# Patient Record
Sex: Female | Born: 1975 | Race: Black or African American | Hispanic: No | Marital: Single | Smoking: Never smoker
Health system: Southern US, Community
[De-identification: ages and names within clinical notes are randomized; demographics above are authoritative.]

## PROBLEM LIST (undated history)

## (undated) DIAGNOSIS — K219 Gastro-esophageal reflux disease without esophagitis: Secondary | ICD-10-CM

## (undated) DIAGNOSIS — M1990 Unspecified osteoarthritis, unspecified site: Secondary | ICD-10-CM

## (undated) HISTORY — PX: CARPAL TUNNEL RELEASE: SHX101

---

## 2013-12-11 ENCOUNTER — Ambulatory Visit (INDEPENDENT_AMBULATORY_CARE_PROVIDER_SITE_OTHER): Payer: BC Managed Care – PPO

## 2013-12-11 VITALS — BP 116/86 | HR 86 | Resp 12

## 2013-12-11 DIAGNOSIS — M21619 Bunion of unspecified foot: Secondary | ICD-10-CM

## 2013-12-11 DIAGNOSIS — M2010 Hallux valgus (acquired), unspecified foot: Secondary | ICD-10-CM

## 2013-12-11 DIAGNOSIS — M779 Enthesopathy, unspecified: Secondary | ICD-10-CM

## 2013-12-11 DIAGNOSIS — G5760 Lesion of plantar nerve, unspecified lower limb: Secondary | ICD-10-CM

## 2013-12-11 DIAGNOSIS — M205X9 Other deformities of toe(s) (acquired), unspecified foot: Secondary | ICD-10-CM

## 2013-12-11 NOTE — Progress Notes (Signed)
   Subjective:    Patient ID: Judith Kennedy, female    DOB: 07/14/1976, 38 y.o.   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 THE FEET ARE GETTING WORSE. THE FEET GETTING AGGRAVATED BY WALKING AND JUMPING AND TRIED NO TREATMENT.    Review of Systems  All other systems reviewed and are negative.      Objective:   Physical Exam Lower extremity objective findings as follows vascular status is intact with pedal pulses palpable DP +2/4 PT +2/4 bilateral capillary refill time 3 seconds all digits. Epicritic and proprioceptive sensations intact and symmetric bilateral. Patient is 38 year old female well-developed well-nourished arch x3 vital signs stable. Neurologically epicritic and proprioceptive sensations again intact on neurologic evaluation skin color pigment normal hair growth absent nails normal trophic. Orthopedic biomechanical exam reveals notable bunion deformity bilateral left and right hallux laterally deviated pushing against the lateral digits as well. There's adductovarus rotation lesser digits 34 and 5 some dorsal displacement second digits bilateral noted clinically and radiographically as well. X-rays reveal elevated I am angle greater than 12 hallux abductus angle greater than 20 to 25 bilateral deviation sesamoid position 5 bilateral. There is some asymmetric joint space narrowing and squaring first metatarsal head bilateral the right great toe joint on clinical evaluation has some dorsal osteophyte and spurring more than left. Patient showing slight elevation of metatarsal and lateral projection first metatarsal. Clinically functional hallux limitus identified with compensatory abduction of the hallux. On weightbearing there is a locking of the great toe joint and early arthritic changes are identified both clinically and radiographically.       Assessment & Plan:  Assessment this time patient does have capsulitis the first MTP  joint both on palpation and range of motion. 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 Patient would benefit from functional orthoses to help stabilize the foot and arch and improve function of the forefoot including the first MTP joint patient is advised however this would not corrected bunion or reduce the bunion deformity and RE cyst or the damage is a recurrent. Patient would be candidate at some point in the future for surgical intervention [facility] with possible decompression and cheilectomy first MTP joint bilateral patient is advised to have that one done at that time. Patient given the options of anti-inflammatory to help temporarily with the pain steroid injections but provided temporary relief the pain however that will not corrected deformity which can only be corrected be a surgical correction. Patient given literature about her bunion deformity and her options for future consideration at this time orthotics skin is carried out for functional orthoses to help stabilize the arch and foot improve the function of the joint. Followup with the next month with orthotics are available for fitting and again future discussions of any other invasive options with a placed per patient request. Patient did request a copy of her x-rays which were furnished for her as well as literature about her options  Alvan Dameichard Gurinder Toral DPM

## 2013-12-11 NOTE — Patient Instructions (Signed)
Osteoarthritis Osteoarthritis is a disease that causes soreness and swelling (inflammation) of a joint. It occurs when the cartilage at the affected joint wears down. Cartilage acts as a cushion, covering the ends of bones where they meet to form a joint. Osteoarthritis is the most common form of arthritis. It often occurs in older people. The joints affected most often by this condition include those in the:  Ends of the fingers.  Thumbs.  Neck.  Lower back.  Knees.  Hips. CAUSES  Over time, the cartilage that covers the ends of bones begins to wear away. This causes bone to rub on bone, producing pain and stiffness in the affected joints.  RISK FACTORS Certain factors can increase your chances of having osteoarthritis, including:  Older age.  Excessive body weight.  Overuse of joints. SIGNS AND SYMPTOMS   Pain, swelling, and stiffness in the joint.  Over time, the joint may lose its normal shape.  Small deposits of bone (osteophytes) may grow on the edges of the joint.  Bits of bone or cartilage can break off and float inside the joint space. This may cause more pain and damage. DIAGNOSIS  Your health care provider will do a physical exam and ask about your symptoms. Various tests may be ordered, such as:  X-rays of the affected joint.  An MRI scan.  Blood tests to rule out other types of arthritis.  Joint fluid tests. This involves using a needle to draw fluid from the joint and examining the fluid under a microscope. TREATMENT  Goals of treatment are to control pain and improve joint function. Treatment plans may include:  A prescribed exercise program that allows for rest and joint relief.  A weight control plan.  Pain relief techniques, such as:  Properly applied heat and cold.  Electric pulses delivered to nerve endings under the skin (transcutaneous electrical nerve stimulation, TENS).  Massage.  Certain nutritional supplements.  Medicines to  control pain, such as:  Acetaminophen.  Nonsteroidal anti-inflammatory drugs (NSAIDs), such as naproxen.  Narcotic or central-acting agents, such as tramadol.  Corticosteroids. These can be given orally or as an injection.  Surgery to reposition the bones and relieve pain (osteotomy) or to remove loose pieces of bone and cartilage. Joint replacement may be needed in advanced states of osteoarthritis. HOME CARE INSTRUCTIONS   Only take over-the-counter or prescription medicines as directed by your health care provider. Take all medicines exactly as instructed.  Maintain a healthy weight. Follow your health care provider's instructions for weight control. This may include dietary instructions.  Exercise as directed. Your health care provider can recommend specific types of exercise. These may include:  Strengthening exercises These are done to strengthen the muscles that support joints affected by arthritis. They can be performed with weights or with exercise bands to add resistance.  Aerobic activities These are exercises, such as brisk walking or low-impact aerobics, that get your heart pumping.  Range-of-motion activities These keep your joints limber.  Balance and agility exercises These help you maintain daily living skills.  Rest your affected joints as directed by your health care provider.  Follow up with your health care provider as directed. SEEK MEDICAL CARE IF:   Your skin turns red.  You develop a rash in addition to your joint pain.  You have worsening joint pain. SEEK IMMEDIATE MEDICAL CARE IF:  You have a significant loss of weight or appetite.  You have a fever along with joint or muscle aches.  You have   night sweats. FOR MORE INFORMATION  National Institute of Arthritis and Musculoskeletal and Skin Diseases: www.niams.http://www.myers.net/nih.gov [employer] A[employer]: https://walker.com/www.nia.nih.gov American College of Rheumatology: www.rheumatology.org Document Released: 07/30/2005  Document Revised: 05/20/2013 Document Reviewed: 04/06/2013 [facility] Patient Information 2014 [place], [place]. Bunion You have a bunion deformity of the feet. This is more common in women. It tends to be an inherited problem. Symptoms can include pain, swelling, and deformity around the great toe. Numbness and tingling may also be present. Your symptoms are often worsened by wearing shoes that cause pressure on the bunion. Changing the type of shoes you wear helps reduce symptoms. A wide shoe decreases pressure on the bunion. An arch support may be used if you have flat feet. Avoid shoes with heels higher than two inches. This puts more pressure on the bunion. X-rays may be helpful in evaluating the severity of the problem. Other foot problems often seen with bunions include corns, calluses, and hammer toes. If the deformity or pain is severe, surgical treatment may be necessary. Keep off your painful foot as much as possible until the pain is relieved. Call your caregiver if your symptoms are worse.  SEEK IMMEDIATE MEDICAL CARE IF:  You have increased redness, pain, swelling, or other symptoms of infection. Document Released: 07/30/2005 Document Revised: 10/22/2011 Document Reviewed: 01/27/2007 [facility] Patient Information 2014 [place]ExitCare, [place].

## 2014-01-01 ENCOUNTER — Ambulatory Visit (INDEPENDENT_AMBULATORY_CARE_PROVIDER_SITE_OTHER): Payer: BC Managed Care – PPO | Admitting: *Deleted

## 2014-01-01 DIAGNOSIS — G5760 Lesion of plantar nerve, unspecified lower limb: Secondary | ICD-10-CM

## 2014-01-01 NOTE — Patient Instructions (Signed)

## 2014-01-01 NOTE — Progress Notes (Signed)
   Subjective:    Patient ID: Judith Kennedy, female    DOB: [birth year], 39 y.o.   MRN: [id number]  HPI PICK UP ORTHOTICS AND GIVEN INSTRUCTION.   Review of Systems     Objective:   Physical Exam        Assessment & Plan:

## 2019-02-25 ENCOUNTER — Other Ambulatory Visit: Payer: Self-pay

## 2019-02-25 DIAGNOSIS — Z20822 Contact with and (suspected) exposure to covid-19: Secondary | ICD-10-CM

## 2019-03-01 LAB — NOVEL CORONAVIRUS, NAA: SARS-CoV-2, NAA: NOT DETECTED

## 2021-02-14 ENCOUNTER — Ambulatory Visit: Payer: Self-pay | Admitting: Student

## 2021-02-17 NOTE — Patient Instructions (Addendum)
DUE TO COVID-19 ONLY ONE VISITOR IS ALLOWED TO COME WITH YOU AND STAY IN THE WAITING ROOM ONLY DURING PRE OP AND PROCEDURE.   **NO VISITORS ARE ALLOWED IN THE SHORT STAY AREA OR RECOVERY ROOM!!**  IF YOU WILL BE ADMITTED INTO THE HOSPITAL YOU ARE ALLOWED ONLY TWO SUPPORT PEOPLE DURING VISITATION HOURS ONLY (10AM -8PM)   The support person(s) may change daily. The support person(s) must pass our screening, gel in and out, and wear a mask at all times, including in the patient's room. Patients must also wear a mask when staff or their support person are in the room.  No visitors under the age of 37. Any visitor under the age of 63 must be accompanied by an adult.    COVID SWAB TESTING MUST BE COMPLETED ON:  Wednesday, 03-01-21 @ 10:00 AM   [address] [place], [place] [address]   You are not required to quarantine, however you are required to wear a well-fitted mask when you are out and around people not in your household.  Hand Hygiene often Do NOT share personal items Notify your provider if you are in close contact with someone who has COVID or you develop fever 100.4 or greater, new onset of sneezing, cough, sore throat, shortness of breath or body aches.         Your procedure is scheduled on:  Thursday, 03-02-21   Report to [facility] Main  Entrance   Report to Short Stay at 5:15 AM   [facility])    Call this number if you have problems the morning of surgery [phone]   Do not eat food :After Midnight.   May have liquids until 4:30 AM  day of surgery  CLEAR LIQUID DIET  Foods Allowed                                                                     Foods Excluded  Water, Black Coffee and tea, regular and decaf                liquids that you cannot  Plain Jell-O in any flavor  (No red)                                      see through such as: Fruit ices (not with fruit pulp)                                      milk, soups, orange juice              Iced  Popsicles (No red)                                      All solid food                                   Apple juices Sports drinks like Gatorade (No red)  Lightly seasoned clear broth or consume(fat free) Sugar, honey syrup     Complete one Ensure drink the morning of surgery at 4:30 AM  the day of surgery.       The day of surgery:  Drink ONE (1) Pre-Surgery Clear Ensure by am the morning of surgery. Drink in one sitting. Do not sip.  This drink was given to you during your hospital  pre-op appointment visit. Nothing else to drink after completing the Pre-Surgery Clear Ensure          If you have questions, please contact your surgeon's office.     Oral Hygiene is also important to reduce your risk of infection.                                    Remember - BRUSH YOUR TEETH THE MORNING OF SURGERY WITH YOUR REGULAR TOOTHPASTE   Do NOT smoke after Midnight   Take these medicines the morning of surgery with A SIP OF WATER:  Zofran if needed                              You may not have any metal on your body including hair pins, jewelry, and body piercing             Do not wear make-up, lotions, powders, perfumes/ or deodorant  Do not wear nail polish including gel and S&S, artificial/acrylic nails, or any other type of covering on natural nails including finger and toenails. If you have artificial nails, gel coating, etc. that needs to be removed by a nail salon please have this removed prior to surgery or surgery may need to be canceled/ delayed if the surgeon/ anesthesia feels like they are unable to be safely monitored.   Do not shave  48 hours prior to surgery.         Do not bring valuables to the hospital. [facility] IS NOT  RESPONSIBLE   FOR VALUABLES.   Contacts, dentures or bridgework may not be worn into surgery.   Bring small overnight bag day of surgery.   Please read over the following fact sheets you were given: IF YOU HAVE QUESTIONS ABOUT YOUR PRE OP  INSTRUCTIONS PLEASE CALL [phone] [facility] - Preparing for Surgery Before surgery, you can play an important role.  Because skin is not sterile, your skin needs to be as free of germs as possible.  You can reduce the number of germs on your skin by washing with CHG (chlorahexidine gluconate) soap before surgery.  CHG is an antiseptic cleaner which kills germs and bonds with the skin to continue killing germs even after washing. Please DO NOT use if you have an allergy to CHG or antibacterial soaps.  If your skin becomes reddened/irritated stop using the CHG and inform your nurse when you arrive at Short Stay. Do not shave (including legs and underarms) for at least 48 hours prior to the first CHG shower.  You may shave your face/neck.  Please follow these instructions carefully:  1.  Shower with CHG Soap the night before surgery and the  morning of surgery.  2.  If you choose to wash your hair, wash your hair first as usual with your normal  shampoo.  3.  After you shampoo, rinse your hair and body thoroughly to remove the  shampoo.                             4.  Use CHG as you would any other liquid soap.  You can apply chg directly to the skin and wash.  Gently with a scrungie or clean washcloth.  5.  Apply the CHG Soap to your body ONLY FROM THE NECK DOWN.   Do   not use on face/ open                           Wound or open sores. Avoid contact with eyes, ears mouth and   genitals (private parts).                       Wash face,  Genitals (private parts) with your normal soap.             6.  Wash thoroughly, paying special attention to the area where your    surgery  will be performed.  7.  Thoroughly rinse your body with warm water from the neck down.  8.  DO NOT shower/wash with your normal soap after using and rinsing off the CHG Soap.                9.  Pat yourself dry with a clean towel.            10.  Wear clean pajamas.            11.  Place clean sheets on your bed  the night of your first shower and do not  sleep with pets. Day of Surgery : Do not apply any lotions/deodorants the morning of surgery.  Please wear clean clothes to the hospital/surgery center.  FAILURE TO FOLLOW THESE INSTRUCTIONS MAY RESULT IN THE CANCELLATION OF YOUR SURGERY  PATIENT SIGNATURE_________________________________  NURSE SIGNATURE__________________________________  ________________________________________________________________________   Rogelia Mire  An incentive spirometer is a tool that can help keep your lungs clear and active. This tool measures how well you are filling your lungs with each breath. Taking long deep breaths may help reverse or decrease the chance of developing breathing (pulmonary) problems (especially infection) following: A long period of time when you are unable to move or be active. BEFORE THE PROCEDURE  If the spirometer includes an indicator to show your best effort, your nurse or respiratory therapist will set it to a desired goal. If possible, sit up straight or lean slightly forward. Try not to slouch. Hold the incentive spirometer in an upright position. INSTRUCTIONS FOR USE  Sit on the edge of your bed if possible, or sit up as far as you can in bed or on a chair. Hold the incentive spirometer in an upright position. Breathe out normally. Place the mouthpiece in your mouth and seal your lips tightly around it. Breathe in slowly and as deeply as possible, raising the piston or the ball toward the top of the column. Hold your breath for 3-5 seconds or for as long as possible. Allow the piston or ball to fall to the bottom of the column. Remove the mouthpiece from your mouth and breathe out normally. Rest for a few seconds and repeat Steps 1 through 7 at least 10 times every 1-2 hours when you are awake. Take your time and take a few normal breaths between deep breaths. The spirometer may include an indicator to  show your best  effort. Use the indicator as a goal to work toward during each repetition. After each set of 10 deep breaths, practice coughing to be sure your lungs are clear. If you have an incision (the cut made at the time of surgery), support your incision when coughing by placing a pillow or rolled up towels firmly against it. Once you are able to get out of bed, walk around indoors and cough well. You may stop using the incentive spirometer when instructed by your caregiver.  RISKS AND COMPLICATIONS Take your time so you do not get dizzy or light-headed. If you are in pain, you may need to take or ask for pain medication before doing incentive spirometry. It is harder to take a deep breath if you are having pain. AFTER USE Rest and breathe slowly and easily. It can be helpful to keep track of a log of your progress. Your caregiver can provide you with a simple table to help with this. If you are using the spirometer at home, follow these instructions: SEEK MEDICAL CARE IF:  You are having difficultly using the spirometer. You have trouble using the spirometer as often as instructed. Your pain medication is not giving enough relief while using the spirometer. You develop fever of 100.5 F (38.1 C) or higher. SEEK IMMEDIATE MEDICAL CARE IF:  You cough up bloody sputum that had not been present before. You develop fever of 102 F (38.9 C) or greater. You develop worsening pain at or near the incision site. MAKE SURE YOU:  Understand these instructions. Will watch your condition. Will get help right away if you are not doing well or get worse. Document Released: 12/10/2006 Document Revised: 10/22/2011 Document Reviewed: 02/10/2007 ExitCare Patient Information 2014 ExitCare, [place].   ________________________________________________________________________  WHAT IS A BLOOD TRANSFUSION? Blood Transfusion Information  A transfusion is the replacement of blood or some of its parts. Blood is made up of  multiple cells which provide different functions. Red blood cells carry oxygen and are used for blood loss replacement. White blood cells fight against infection. Platelets control bleeding. Plasma helps clot blood. Other blood products are available for specialized needs, such as hemophilia or other clotting disorders. BEFORE THE TRANSFUSION  Who gives blood for transfusions?  Healthy volunteers who are fully evaluated to make sure their blood is safe. This is blood bank blood. Transfusion therapy is the safest it has ever been in the practice of medicine. Before blood is taken from a donor, a complete history is taken to make sure that person has no history of diseases nor engages in risky social behavior (examples are intravenous drug use or sexual activity with multiple partners). The donor's travel history is screened to minimize risk of transmitting infections, such as malaria. The donated blood is tested for signs of infectious diseases, such as HIV and hepatitis. The blood is then tested to be sure it is compatible with you in order to minimize the chance of a transfusion reaction. If you or a relative donates blood, this is often done in anticipation of surgery and is not appropriate for emergency situations. It takes many days to process the donated blood. RISKS AND COMPLICATIONS Although transfusion therapy is very safe and saves many lives, the main dangers of transfusion include:  Getting an infectious disease. Developing a transfusion reaction. This is an allergic reaction to something in the blood you were given. Every precaution is taken to prevent this. The decision to have a blood transfusion has  been considered carefully by your caregiver before blood is given. Blood is not given unless the benefits outweigh the risks. AFTER THE TRANSFUSION Right after receiving a blood transfusion, you will usually feel much better and more energetic. This is especially true if your red blood  cells have gotten low (anemic). The transfusion raises the level of the red blood cells which carry oxygen, and this usually causes an energy increase. The nurse administering the transfusion will monitor you carefully for complications. HOME CARE INSTRUCTIONS  No special instructions are needed after a transfusion. You may find your energy is better. Speak with your caregiver about any limitations on activity for underlying diseases you may have. SEEK MEDICAL CARE IF:  Your condition is not improving after your transfusion. You develop redness or irritation at the intravenous (IV) site. SEEK IMMEDIATE MEDICAL CARE IF:  Any of the following symptoms occur over the next 12 hours: Shaking chills. You have a temperature by mouth above 102 F (38.9 C), not controlled by medicine. Chest, back, or muscle pain. People around you feel you are not acting correctly or are confused. Shortness of breath or difficulty breathing. Dizziness and fainting. You get a rash or develop hives. You have a decrease in urine output. Your urine turns a dark color or changes to pink, red, or brown. Any of the following symptoms occur over the next 10 days: You have a temperature by mouth above 102 F (38.9 C), not controlled by medicine. Shortness of breath. Weakness after normal activity. The white part of the eye turns yellow (jaundice). You have a decrease in the amount of urine or are urinating less often. Your urine turns a dark color or changes to pink, red, or brown. Document Released: 07/27/2000 Document Revised: 10/22/2011 Document Reviewed: 03/15/2008 [facility]Exit[facility] Patient Information 2014 [place], [place].  _______________________________________________________________________

## 2021-02-20 ENCOUNTER — Other Ambulatory Visit: Payer: Self-pay

## 2021-02-20 ENCOUNTER — Encounter (HOSPITAL_COMMUNITY)
Admission: RE | Admit: 2021-02-20 | Discharge: 2021-02-20 | Disposition: A | Discharge status: Home / Self Care | Payer: No Typology Code available for payment source | Source: Ambulatory Visit | Attending: Orthopedic Surgery | Admitting: Orthopedic Surgery

## 2021-02-20 ENCOUNTER — Encounter (HOSPITAL_COMMUNITY): Payer: Self-pay

## 2021-02-20 DIAGNOSIS — Z01812 Encounter for preprocedural laboratory examination: Secondary | ICD-10-CM | POA: Diagnosis present

## 2021-02-20 HISTORY — DX: Unspecified osteoarthritis, unspecified site: M19.90

## 2021-02-20 HISTORY — DX: Gastro-esophageal reflux disease without esophagitis: K21.9

## 2021-02-20 LAB — PROTIME-INR
INR: 1 (ref 0.8–1.2)
Prothrombin Time: 13.5 seconds (ref 11.4–15.2)

## 2021-02-20 LAB — CBC
HCT: 32.3 % — ABNORMAL LOW (ref 36.0–46.0)
Hemoglobin: 9.8 g/dL — ABNORMAL LOW (ref 12.0–15.0)
MCH: 23.9 pg — ABNORMAL LOW (ref 26.0–34.0)
MCHC: 30.3 g/dL (ref 30.0–36.0)
MCV: 78.8 fL — ABNORMAL LOW (ref 80.0–100.0)
Platelets: 529 10*3/uL — ABNORMAL HIGH (ref 150–400)
RBC: 4.1 MIL/uL (ref 3.87–5.11)
RDW: 16.1 % — ABNORMAL HIGH (ref 11.5–15.5)
WBC: 6.1 10*3/uL (ref 4.0–10.5)
nRBC: 0 % (ref 0.0–0.2)

## 2021-02-20 LAB — URINALYSIS, ROUTINE W REFLEX MICROSCOPIC
Bacteria, UA: NONE SEEN
Bilirubin Urine: NEGATIVE
Glucose, UA: NEGATIVE mg/dL
Hgb urine dipstick: NEGATIVE
Ketones, ur: 80 mg/dL — AB
Leukocytes,Ua: NEGATIVE
Nitrite: NEGATIVE
Protein, ur: 30 mg/dL — AB
Specific Gravity, Urine: 1.03 (ref 1.005–1.030)
pH: 5 (ref 5.0–8.0)

## 2021-02-20 LAB — COMPREHENSIVE METABOLIC PANEL
ALT: 11 U/L (ref 0–44)
AST: 15 U/L (ref 15–41)
Albumin: 3.3 g/dL — ABNORMAL LOW (ref 3.5–5.0)
Alkaline Phosphatase: 75 U/L (ref 38–126)
Anion gap: 9 (ref 5–15)
BUN: 9 mg/dL (ref 6–20)
CO2: 24 mmol/L (ref 22–32)
Calcium: 8.9 mg/dL (ref 8.9–10.3)
Chloride: 106 mmol/L (ref 98–111)
Creatinine, Ser: 0.59 mg/dL (ref 0.44–1.00)
GFR, Estimated: >60 mL/min (ref >60)
Glucose, Bld: 83 mg/dL (ref 70–99)
Potassium: 3.7 mmol/L (ref 3.5–5.1)
Sodium: 139 mmol/L (ref 135–145)
Total Bilirubin: 0.5 mg/dL (ref 0.3–1.2)
Total Protein: 7.2 g/dL (ref 6.5–8.1)

## 2021-02-20 LAB — SURGICAL PCR SCREEN
MRSA, PCR: NEGATIVE
Staphylococcus aureus: NEGATIVE

## 2021-02-20 NOTE — Progress Notes (Signed)
COVID Vaccine Completed:  Yes x3 Date COVID Vaccine completed: Has received booster: COVID vaccine manufacturer:    Moderna   J  Date of COVID positive in last 90 days:  N/A  PCP - Coralee Rud, DO Cardiologist - N/A  Chest x-ray - N/A EKG - N/A Stress Test - N/A ECHO - N/A Cardiac Cath - N/A Pacemaker/ICD device last checked: Spinal Cord Stimulator:  Sleep Study - N/A CPAP -   Fasting Blood Sugar - N/A Checks Blood Sugar _____ times a day  Blood Thinner Instructions:  N/A Aspirin Instructions: Last Dose:  Activity level:  Can go up a flight of stairs and perform activities of daily living without stopping and without symptoms of chest pain or shortness of breath.    Anesthesia review:  N/A  Patient denies shortness of breath, fever, cough and chest pain at PAT appointment   Patient verbalized understanding of instructions that were given to them at the PAT appointment. Patient was also instructed that they will need to review over the PAT instructions again at home before surgery.

## 2021-02-20 NOTE — Progress Notes (Signed)
CBC sent to Dr. Linna Caprice to review.

## 2021-03-01 ENCOUNTER — Inpatient Hospital Stay (HOSPITAL_COMMUNITY): Admission: RE | Admit: 2021-03-01 | Payer: No Typology Code available for payment source | Source: Ambulatory Visit

## 2021-03-02 ENCOUNTER — Encounter (HOSPITAL_COMMUNITY): Admission: RE | Payer: Self-pay | Source: Ambulatory Visit

## 2021-03-02 ENCOUNTER — Ambulatory Visit (HOSPITAL_COMMUNITY)
Admission: RE | Admit: 2021-03-02 | Payer: No Typology Code available for payment source | Source: Ambulatory Visit | Admitting: Orthopedic Surgery

## 2021-03-02 LAB — TYPE AND SCREEN
ABO/RH(D): A POS
Antibody Screen: NEGATIVE

## 2021-03-02 SURGERY — ARTHROPLASTY, KNEE, TOTAL, USING IMAGELESS COMPUTER-ASSISTED NAVIGATION
Anesthesia: Spinal | Site: Knee | Laterality: Left

## 2021-03-02 NOTE — Progress Notes (Signed)
Spoke to patient and gave her updated arrival time of 0845 for procedure on 03/09/21.  Patient was advised to stop clear liquids at 0815.  Covid test scheduled for 03-07-21 at 1205 PM.  Patient stated understanding.

## 2021-03-03 ENCOUNTER — Ambulatory Visit: Payer: Self-pay | Admitting: Student

## 2021-03-07 ENCOUNTER — Other Ambulatory Visit (HOSPITAL_COMMUNITY)
Admission: RE | Admit: 2021-03-07 | Discharge: 2021-03-07 | Disposition: A | Discharge status: Home / Self Care | Payer: No Typology Code available for payment source | Source: Ambulatory Visit | Attending: Orthopedic Surgery | Admitting: Orthopedic Surgery

## 2021-03-07 DIAGNOSIS — Z01812 Encounter for preprocedural laboratory examination: Secondary | ICD-10-CM | POA: Insufficient documentation

## 2021-03-07 DIAGNOSIS — Z20822 Contact with and (suspected) exposure to covid-19: Secondary | ICD-10-CM | POA: Insufficient documentation

## 2021-03-07 LAB — SARS CORONAVIRUS 2 (TAT 6-24 HRS): SARS Coronavirus 2: NEGATIVE

## 2021-03-08 ENCOUNTER — Ambulatory Visit: Payer: Self-pay | Admitting: Student

## 2021-03-08 MED ORDER — VANCOMYCIN HCL 1.5 G IV SOLR
1500.0000 mg | INTRAVENOUS | Status: AC
  Administered 2021-03-09: 1500 mg via INTRAVENOUS
  Filled 2021-03-08: qty 1500

## 2021-03-08 NOTE — H&P (View-Only) (Signed)
TOTAL KNEE ADMISSION H&P  Patient is being admitted for left total knee arthroplasty.  Subjective:  Chief Complaint:left knee pain.  HPI: Judith Kennedy, 45 y.o. female, has a history of pain and functional disability in the left knee due to arthritis and has failed non-surgical conservative treatments for greater than 12 weeks to includeNSAID's and/or analgesics, corticosteriod injections, weight reduction as appropriate, and activity modification.  Onset of symptoms was gradual, starting 3 years ago with gradually worsening course since that time. The patient noted no past surgery on the left knee(s).  Patient currently rates pain in the left knee(s) at 8 out of 10 with activity. Patient has worsening of pain with activity and weight bearing, pain that interferes with activities of daily living, pain with passive range of motion, and crepitus.  Patient has evidence of subchondral cysts, subchondral sclerosis, and joint space narrowing by imaging studies. There is no active infection.  There are no problems to display for this patient.  Past Medical History:  Diagnosis Date   Arthritis    GERD (gastroesophageal reflux disease)     No past surgical history on file.  Current Outpatient Medications  Medication Sig Dispense Refill Last Dose   diclofenac (VOLTAREN) 75 MG EC tablet Take 75 mg by mouth 2 (two) times daily.      methocarbamol (ROBAXIN) 500 MG tablet Take 500 mg by mouth every 6 (six) hours as needed for muscle spasms.      ondansetron (ZOFRAN) 4 MG tablet Take 4 mg by mouth every 8 (eight) hours as needed for nausea or vomiting.      SAXENDA 18 MG/3ML SOPN Inject 3 mg into the skin daily.      Turmeric 500 MG TABS Take 500 mg by mouth 5 (five) times daily.      No current facility-administered medications for this visit.   Facility-Administered Medications Ordered in Other Visits  Medication Dose Route Frequency Provider Last Rate Last Admin   [START ON 03/09/2021] Vancomycin  (VANCOCIN) 1,500 mg in sodium chloride 0.9 % 500 mL IVPB  1,500 mg Intravenous On Call to OR Rexford Maus, [facility]       Allergies  Allergen Reactions   Penicillins Anaphylaxis and Swelling    Social History   Tobacco Use   Smoking status: Never   Smokeless tobacco: Never  Substance Use Topics   Alcohol use: No    No family history on file.   Review of Systems  Musculoskeletal:  Positive for arthralgias.  All other systems reviewed and are negative.  Objective:  Physical Exam Constitutional:      Appearance: She is obese.  HENT:     Head: Normocephalic.  Eyes:     Pupils: Pupils are equal, round, and reactive to light.  Cardiovascular:     Rate and Rhythm: Normal rate and regular rhythm.  Pulmonary:     Effort: Pulmonary effort is normal.  Abdominal:     Palpations: Abdomen is soft.     Tenderness: There is no abdominal tenderness.  Genitourinary:    Comments: Deferred Musculoskeletal:        General: Tenderness present.     Cervical back: Normal range of motion.  Skin:    General: Skin is warm and dry.  Neurological:     Mental Status: She is alert and oriented to person, place, and time.  Psychiatric:        Behavior: Behavior normal.    Vital signs in last 24 hours: @VSRANGES @  Labs:  Estimated body mass index is 38.29 kg/m as calculated from the following:   Height as of 02/20/21: 5' 3.5" (1.613 m).   Weight as of 02/20/21: 99.6 kg.   Imaging Review Plain radiographs demonstrate severe degenerative joint disease of the left knee(s). The bone quality appears to be adequate for age and reported activity level.      Assessment/Plan:  End stage arthritis, left knee   The patient history, physical examination, clinical judgment of the provider and imaging studies are consistent with end stage degenerative joint disease of the left knee(s) and total knee arthroplasty is deemed medically necessary. The treatment options including medical  management, injection therapy arthroscopy and arthroplasty were discussed at length. The risks and benefits of total knee arthroplasty were presented and reviewed. The risks due to aseptic loosening, infection, stiffness, patella tracking problems, thromboembolic complications and other imponderables were discussed. The patient acknowledged the explanation, agreed to proceed with the plan and consent was signed. Patient is being admitted for inpatient treatment for surgery, pain control, PT, OT, prophylactic antibiotics, VTE prophylaxis, progressive ambulation and ADL's and discharge planning. The patient is planning to be discharged  home after overnight observation     Patient's anticipated LOS is less than 2 midnights, meeting these requirements: - Younger than 65 - Lives within 1 hour of care - Has a competent adult at home to recover with post-op recover - NO history of  - Chronic pain requiring opiods  - Diabetes  - Coronary Artery Disease  - Heart failure  - Heart attack  - Stroke  - DVT/VTE  - Cardiac arrhythmia  - Respiratory Failure/COPD  - Renal failure  - Anemia  - Advanced Liver disease   

## 2021-03-08 NOTE — H&P (Signed)
TOTAL KNEE ADMISSION H&P  Patient is being admitted for left total knee arthroplasty.  Subjective:  Chief Complaint:left knee pain.  HPI: Judith Kennedy, 45 y.o. female, has a history of pain and functional disability in the left knee due to arthritis and has failed non-surgical conservative treatments for greater than 12 weeks to includeNSAID's and/or analgesics, corticosteriod injections, weight reduction as appropriate, and activity modification.  Onset of symptoms was gradual, starting 3 years ago with gradually worsening course since that time. The patient noted no past surgery on the left knee(s).  Patient currently rates pain in the left knee(s) at 8 out of 10 with activity. Patient has worsening of pain with activity and weight bearing, pain that interferes with activities of daily living, pain with passive range of motion, and crepitus.  Patient has evidence of subchondral cysts, subchondral sclerosis, and joint space narrowing by imaging studies. There is no active infection.  There are no problems to display for this patient.  Past Medical History:  Diagnosis Date   Arthritis    GERD (gastroesophageal reflux disease)     No past surgical history on file.  Current Outpatient Medications  Medication Sig Dispense Refill Last Dose   diclofenac (VOLTAREN) 75 MG EC tablet Take 75 mg by mouth 2 (two) times daily.      methocarbamol (ROBAXIN) 500 MG tablet Take 500 mg by mouth every 6 (six) hours as needed for muscle spasms.      ondansetron (ZOFRAN) 4 MG tablet Take 4 mg by mouth every 8 (eight) hours as needed for nausea or vomiting.      SAXENDA 18 MG/3ML SOPN Inject 3 mg into the skin daily.      Turmeric 500 MG TABS Take 500 mg by mouth 5 (five) times daily.      No current facility-administered medications for this visit.   Facility-Administered Medications Ordered in Other Visits  Medication Dose Route Frequency Provider Last Rate Last Admin   [START ON 03/09/2021] Vancomycin  (VANCOCIN) 1,500 mg in sodium chloride 0.9 % 500 mL IVPB  1,500 mg Intravenous On Call to OR Rexford Maus, [facility]       Allergies  Allergen Reactions   Penicillins Anaphylaxis and Swelling    Social History   Tobacco Use   Smoking status: Never   Smokeless tobacco: Never  Substance Use Topics   Alcohol use: No    No family history on file.   Review of Systems  Musculoskeletal:  Positive for arthralgias.  All other systems reviewed and are negative.  Objective:  Physical Exam Constitutional:      Appearance: She is obese.  HENT:     Head: Normocephalic.  Eyes:     Pupils: Pupils are equal, round, and reactive to light.  Cardiovascular:     Rate and Rhythm: Normal rate and regular rhythm.  Pulmonary:     Effort: Pulmonary effort is normal.  Abdominal:     Palpations: Abdomen is soft.     Tenderness: There is no abdominal tenderness.  Genitourinary:    Comments: Deferred Musculoskeletal:        General: Tenderness present.     Cervical back: Normal range of motion.  Skin:    General: Skin is warm and dry.  Neurological:     Mental Status: She is alert and oriented to person, place, and time.  Psychiatric:        Behavior: Behavior normal.    Vital signs in last 24 hours: @VSRANGES @  Labs:  Estimated body mass index is 38.29 kg/m as calculated from the following:   Height as of 02/20/21: 5' 3.5" (1.613 m).   Weight as of 02/20/21: 99.6 kg.   Imaging Review Plain radiographs demonstrate severe degenerative joint disease of the left knee(s). The bone quality appears to be adequate for age and reported activity level.      Assessment/Plan:  End stage arthritis, left knee   The patient history, physical examination, clinical judgment of the provider and imaging studies are consistent with end stage degenerative joint disease of the left knee(s) and total knee arthroplasty is deemed medically necessary. The treatment options including medical  management, injection therapy arthroscopy and arthroplasty were discussed at length. The risks and benefits of total knee arthroplasty were presented and reviewed. The risks due to aseptic loosening, infection, stiffness, patella tracking problems, thromboembolic complications and other imponderables were discussed. The patient acknowledged the explanation, agreed to proceed with the plan and consent was signed. Patient is being admitted for inpatient treatment for surgery, pain control, PT, OT, prophylactic antibiotics, VTE prophylaxis, progressive ambulation and ADL's and discharge planning. The patient is planning to be discharged  home after overnight observation     Patient's anticipated LOS is less than 2 midnights, meeting these requirements: - Younger than 56 - Lives within 1 hour of care - Has a competent adult at home to recover with post-op recover - NO history of  - Chronic pain requiring opiods  - Diabetes  - Coronary Artery Disease  - Heart failure  - Heart attack  - Stroke  - DVT/VTE  - Cardiac arrhythmia  - Respiratory Failure/COPD  - Renal failure  - Anemia  - Advanced Liver disease

## 2021-03-09 ENCOUNTER — Ambulatory Visit (HOSPITAL_COMMUNITY)
Admission: RE | Admit: 2021-03-09 | Discharge: 2021-03-10 | Disposition: A | Discharge status: Home / Self Care | Payer: No Typology Code available for payment source | Attending: Orthopedic Surgery | Admitting: Orthopedic Surgery

## 2021-03-09 ENCOUNTER — Other Ambulatory Visit: Payer: Self-pay

## 2021-03-09 ENCOUNTER — Ambulatory Visit (HOSPITAL_COMMUNITY): Payer: No Typology Code available for payment source

## 2021-03-09 ENCOUNTER — Ambulatory Visit (HOSPITAL_COMMUNITY): Payer: No Typology Code available for payment source | Admitting: Certified Registered Nurse Anesthetist

## 2021-03-09 ENCOUNTER — Encounter (HOSPITAL_COMMUNITY)
Admission: RE | Disposition: A | Discharge status: Home / Self Care | Payer: Self-pay | Source: Home / Self Care | Attending: Orthopedic Surgery

## 2021-03-09 ENCOUNTER — Encounter (HOSPITAL_COMMUNITY): Payer: Self-pay | Admitting: Orthopedic Surgery

## 2021-03-09 DIAGNOSIS — Z880 Allergy status to penicillin: Secondary | ICD-10-CM | POA: Insufficient documentation

## 2021-03-09 DIAGNOSIS — M1712 Unilateral primary osteoarthritis, left knee: Secondary | ICD-10-CM | POA: Diagnosis present

## 2021-03-09 DIAGNOSIS — Z96652 Presence of left artificial knee joint: Secondary | ICD-10-CM

## 2021-03-09 DIAGNOSIS — Z79899 Other long term (current) drug therapy: Secondary | ICD-10-CM | POA: Diagnosis not present

## 2021-03-09 HISTORY — PX: KNEE ARTHROPLASTY: SHX992

## 2021-03-09 LAB — COMPREHENSIVE METABOLIC PANEL
ALT: 11 U/L (ref 0–44)
AST: 16 U/L (ref 15–41)
Albumin: 3.3 g/dL — ABNORMAL LOW (ref 3.5–5.0)
Alkaline Phosphatase: 73 U/L (ref 38–126)
Anion gap: 6 (ref 5–15)
BUN: 9 mg/dL (ref 6–20)
CO2: 25 mmol/L (ref 22–32)
Calcium: 8.5 mg/dL — ABNORMAL LOW (ref 8.9–10.3)
Chloride: 105 mmol/L (ref 98–111)
Creatinine, Ser: 0.61 mg/dL (ref 0.44–1.00)
GFR, Estimated: >60 mL/min (ref >60)
Glucose, Bld: 117 mg/dL — ABNORMAL HIGH (ref 70–99)
Potassium: 3.8 mmol/L (ref 3.5–5.1)
Sodium: 136 mmol/L (ref 135–145)
Total Bilirubin: 0.6 mg/dL (ref 0.3–1.2)
Total Protein: 6.8 g/dL (ref 6.5–8.1)

## 2021-03-09 LAB — CBC
HCT: 33.9 % — ABNORMAL LOW (ref 36.0–46.0)
Hemoglobin: 10.5 g/dL — ABNORMAL LOW (ref 12.0–15.0)
MCH: 24.2 pg — ABNORMAL LOW (ref 26.0–34.0)
MCHC: 31 g/dL (ref 30.0–36.0)
MCV: 78.3 fL — ABNORMAL LOW (ref 80.0–100.0)
Platelets: 533 10*3/uL — ABNORMAL HIGH (ref 150–400)
RBC: 4.33 MIL/uL (ref 3.87–5.11)
RDW: 16.6 % — ABNORMAL HIGH (ref 11.5–15.5)
WBC: 10.5 10*3/uL (ref 4.0–10.5)
nRBC: 0 % (ref 0.0–0.2)

## 2021-03-09 LAB — PROTIME-INR
INR: 1 (ref 0.8–1.2)
Prothrombin Time: 13.5 seconds (ref 11.4–15.2)

## 2021-03-09 LAB — PREGNANCY, URINE: Preg Test, Ur: NEGATIVE

## 2021-03-09 SURGERY — ARTHROPLASTY, KNEE, TOTAL, USING IMAGELESS COMPUTER-ASSISTED NAVIGATION
Anesthesia: Regional | Site: Knee | Laterality: Left

## 2021-03-09 MED ORDER — MIDAZOLAM HCL 2 MG/2ML IJ SOLN
INTRAMUSCULAR | Status: DC | PRN
  Administered 2021-03-09 (×2): 1 mg via INTRAVENOUS

## 2021-03-09 MED ORDER — METHOCARBAMOL 500 MG IVPB - SIMPLE MED
500.0000 mg | Freq: Four times a day (QID) | INTRAVENOUS | Status: DC | PRN
  Filled 2021-03-09: qty 50

## 2021-03-09 MED ORDER — ONDANSETRON HCL 4 MG/2ML IJ SOLN
INTRAMUSCULAR | Status: DC | PRN
  Administered 2021-03-09: 4 mg via INTRAVENOUS

## 2021-03-09 MED ORDER — ACETAMINOPHEN 500 MG PO TABS
1000.0000 mg | ORAL_TABLET | Freq: Once | ORAL | Status: AC
  Administered 2021-03-09: 1000 mg via ORAL
  Filled 2021-03-09: qty 2

## 2021-03-09 MED ORDER — SODIUM CHLORIDE 0.9 % IV SOLN: INTRAVENOUS | Status: DC

## 2021-03-09 MED ORDER — DIPHENHYDRAMINE HCL 12.5 MG/5ML PO ELIX: 12.5000 mg | ORAL_SOLUTION | ORAL | Status: DC | PRN

## 2021-03-09 MED ORDER — OXYCODONE HCL 5 MG/5ML PO SOLN: 5.0000 mg | Freq: Once | ORAL | Status: AC | PRN

## 2021-03-09 MED ORDER — OXYCODONE HCL 5 MG PO TABS
ORAL_TABLET | ORAL | Status: AC
  Filled 2021-03-09: qty 1

## 2021-03-09 MED ORDER — DEXAMETHASONE SODIUM PHOSPHATE 10 MG/ML IJ SOLN
INTRAMUSCULAR | Status: AC
  Filled 2021-03-09: qty 1

## 2021-03-09 MED ORDER — ASPIRIN 81 MG PO CHEW
81.0000 mg | CHEWABLE_TABLET | Freq: Two times a day (BID) | ORAL | Status: DC
  Administered 2021-03-09 – 2021-03-10 (×2): 81 mg via ORAL
  Filled 2021-03-09 (×2): qty 1

## 2021-03-09 MED ORDER — DEXAMETHASONE SODIUM PHOSPHATE 10 MG/ML IJ SOLN
INTRAMUSCULAR | Status: DC | PRN
  Administered 2021-03-09: 5 mg

## 2021-03-09 MED ORDER — BUPIVACAINE-EPINEPHRINE 0.25% -1:200000 IJ SOLN
INTRAMUSCULAR | Status: DC | PRN
  Administered 2021-03-09: 30 mL

## 2021-03-09 MED ORDER — DOCUSATE SODIUM 100 MG PO CAPS
100.0000 mg | ORAL_CAPSULE | Freq: Two times a day (BID) | ORAL | Status: DC
  Administered 2021-03-09 – 2021-03-10 (×2): 100 mg via ORAL
  Filled 2021-03-09 (×2): qty 1

## 2021-03-09 MED ORDER — CELECOXIB 200 MG PO CAPS
200.0000 mg | ORAL_CAPSULE | Freq: Two times a day (BID) | ORAL | Status: DC
  Administered 2021-03-09 – 2021-03-10 (×2): 200 mg via ORAL
  Filled 2021-03-09 (×2): qty 1

## 2021-03-09 MED ORDER — CLINDAMYCIN PHOSPHATE 600 MG/50ML IV SOLN
600.0000 mg | Freq: Four times a day (QID) | INTRAVENOUS | Status: AC
  Administered 2021-03-09 (×2): 600 mg via INTRAVENOUS
  Filled 2021-03-09 (×2): qty 50

## 2021-03-09 MED ORDER — ONDANSETRON HCL 4 MG/2ML IJ SOLN: 4.0000 mg | Freq: Four times a day (QID) | INTRAMUSCULAR | Status: DC | PRN

## 2021-03-09 MED ORDER — MORPHINE SULFATE (PF) 4 MG/ML IV SOLN
0.5000 mg | INTRAVENOUS | Status: DC | PRN
  Administered 2021-03-09 – 2021-03-10 (×4): 1 mg via INTRAVENOUS
  Filled 2021-03-09 (×5): qty 1

## 2021-03-09 MED ORDER — LACTATED RINGERS IV SOLN
INTRAVENOUS | Status: DC | PRN
Start: 2021-03-09 — End: 2021-03-09

## 2021-03-09 MED ORDER — ISOPROPYL ALCOHOL 70 % SOLN
Status: DC | PRN
  Administered 2021-03-09: 1 via TOPICAL

## 2021-03-09 MED ORDER — BUPIVACAINE IN DEXTROSE 0.75-8.25 % IT SOLN
INTRATHECAL | Status: DC | PRN
  Administered 2021-03-09: 2 mL via INTRATHECAL

## 2021-03-09 MED ORDER — MIDAZOLAM HCL 2 MG/2ML IJ SOLN
INTRAMUSCULAR | Status: AC
  Administered 2021-03-09: 2 mg via INTRAVENOUS
  Filled 2021-03-09: qty 2

## 2021-03-09 MED ORDER — 0.9 % SODIUM CHLORIDE (POUR BTL) OPTIME
TOPICAL | Status: DC | PRN
  Administered 2021-03-09: 1000 mL

## 2021-03-09 MED ORDER — TRANEXAMIC ACID-NACL 1000-0.7 MG/100ML-% IV SOLN
1000.0000 mg | INTRAVENOUS | Status: AC
  Administered 2021-03-09: 1000 mg via INTRAVENOUS
  Filled 2021-03-09: qty 100

## 2021-03-09 MED ORDER — HYDROCODONE-ACETAMINOPHEN 5-325 MG PO TABS
1.0000 | ORAL_TABLET | ORAL | Status: DC | PRN
  Administered 2021-03-09: 2 via ORAL
  Filled 2021-03-09: qty 2

## 2021-03-09 MED ORDER — PROPOFOL 1000 MG/100ML IV EMUL
INTRAVENOUS | Status: AC
  Filled 2021-03-09: qty 100

## 2021-03-09 MED ORDER — DEXAMETHASONE SODIUM PHOSPHATE 10 MG/ML IJ SOLN
10.0000 mg | Freq: Once | INTRAMUSCULAR | Status: AC
  Administered 2021-03-10: 10 mg via INTRAVENOUS
  Filled 2021-03-09: qty 1

## 2021-03-09 MED ORDER — SODIUM CHLORIDE (PF) 0.9 % IJ SOLN
INTRAMUSCULAR | Status: AC
  Filled 2021-03-09: qty 30

## 2021-03-09 MED ORDER — ROPIVACAINE HCL 5 MG/ML IJ SOLN
INTRAMUSCULAR | Status: DC | PRN
  Administered 2021-03-09: 20 mL via PERINEURAL

## 2021-03-09 MED ORDER — PROPOFOL 10 MG/ML IV BOLUS
INTRAVENOUS | Status: AC
  Filled 2021-03-09: qty 20

## 2021-03-09 MED ORDER — METHOCARBAMOL 500 MG IVPB - SIMPLE MED
INTRAVENOUS | Status: AC
  Administered 2021-03-09: 500 mg
  Filled 2021-03-09: qty 50

## 2021-03-09 MED ORDER — PHENYLEPHRINE 40 MCG/ML (10ML) SYRINGE FOR IV PUSH (FOR BLOOD PRESSURE SUPPORT)
PREFILLED_SYRINGE | INTRAVENOUS | Status: DC | PRN
  Administered 2021-03-09 (×2): 40 ug via INTRAVENOUS
  Administered 2021-03-09: 80 ug via INTRAVENOUS

## 2021-03-09 MED ORDER — POLYETHYLENE GLYCOL 3350 17 G PO PACK: 17.0000 g | PACK | Freq: Every day | ORAL | Status: DC | PRN

## 2021-03-09 MED ORDER — SODIUM CHLORIDE 0.9% FLUSH
INTRAVENOUS | Status: DC | PRN
  Administered 2021-03-09: 30 mL

## 2021-03-09 MED ORDER — PROPOFOL 10 MG/ML IV BOLUS
INTRAVENOUS | Status: DC | PRN
  Administered 2021-03-09 (×8): 20 mg via INTRAVENOUS
  Administered 2021-03-09: 10 mg via INTRAVENOUS

## 2021-03-09 MED ORDER — CLINDAMYCIN PHOSPHATE 900 MG/50ML IV SOLN
900.0000 mg | INTRAVENOUS | Status: AC
  Administered 2021-03-09: 900 mg via INTRAVENOUS
  Filled 2021-03-09: qty 50

## 2021-03-09 MED ORDER — KETOROLAC TROMETHAMINE 30 MG/ML IJ SOLN
INTRAMUSCULAR | Status: DC | PRN
  Administered 2021-03-09: 30 mg

## 2021-03-09 MED ORDER — STERILE WATER FOR IRRIGATION IR SOLN
Status: DC | PRN
  Administered 2021-03-09: 2000 mL

## 2021-03-09 MED ORDER — PHENOL 1.4 % MT LIQD: 1.0000 | OROMUCOSAL | Status: DC | PRN

## 2021-03-09 MED ORDER — ONDANSETRON HCL 4 MG/2ML IJ SOLN
INTRAMUSCULAR | Status: AC
  Filled 2021-03-09: qty 2

## 2021-03-09 MED ORDER — FENTANYL CITRATE (PF) 100 MCG/2ML IJ SOLN
50.0000 ug | INTRAMUSCULAR | Status: DC
Start: 2021-03-09 — End: 2021-03-09
  Administered 2021-03-09: 50 ug via INTRAVENOUS

## 2021-03-09 MED ORDER — PROPOFOL 500 MG/50ML IV EMUL
INTRAVENOUS | Status: DC | PRN
  Administered 2021-03-09: 75 ug/kg/min via INTRAVENOUS
  Administered 2021-03-09: 50 ug/kg/min via INTRAVENOUS

## 2021-03-09 MED ORDER — HYDROCODONE-ACETAMINOPHEN 5-325 MG PO TABS
ORAL_TABLET | ORAL | Status: AC
  Administered 2021-03-09: 1
  Filled 2021-03-09: qty 1

## 2021-03-09 MED ORDER — METOCLOPRAMIDE HCL 5 MG/ML IJ SOLN
5.0000 mg | Freq: Three times a day (TID) | INTRAMUSCULAR | Status: DC | PRN
  Administered 2021-03-10: 10 mg via INTRAVENOUS
  Filled 2021-03-09: qty 2

## 2021-03-09 MED ORDER — POVIDONE-IODINE 10 % EX SWAB: 2.0000 "application " | Freq: Once | CUTANEOUS | Status: DC

## 2021-03-09 MED ORDER — ALUM & MAG HYDROXIDE-SIMETH 200-200-20 MG/5ML PO SUSP: 30.0000 mL | ORAL | Status: DC | PRN

## 2021-03-09 MED ORDER — KETOROLAC TROMETHAMINE 30 MG/ML IJ SOLN
INTRAMUSCULAR | Status: AC
  Filled 2021-03-09: qty 1

## 2021-03-09 MED ORDER — FENTANYL CITRATE (PF) 100 MCG/2ML IJ SOLN: 25.0000 ug | INTRAMUSCULAR | Status: DC | PRN

## 2021-03-09 MED ORDER — METHOCARBAMOL 500 MG PO TABS
500.0000 mg | ORAL_TABLET | Freq: Four times a day (QID) | ORAL | Status: DC | PRN
  Administered 2021-03-09 – 2021-03-10 (×3): 500 mg via ORAL
  Filled 2021-03-09 (×3): qty 1

## 2021-03-09 MED ORDER — MIDAZOLAM HCL 2 MG/2ML IJ SOLN
INTRAMUSCULAR | Status: AC
  Filled 2021-03-09: qty 2

## 2021-03-09 MED ORDER — PHENYLEPHRINE 40 MCG/ML (10ML) SYRINGE FOR IV PUSH (FOR BLOOD PRESSURE SUPPORT)
PREFILLED_SYRINGE | INTRAVENOUS | Status: AC
  Filled 2021-03-09: qty 10

## 2021-03-09 MED ORDER — MIDAZOLAM HCL 2 MG/2ML IJ SOLN: 1.0000 mg | INTRAMUSCULAR | Status: DC

## 2021-03-09 MED ORDER — LIDOCAINE HCL (CARDIAC) PF 100 MG/5ML IV SOSY
PREFILLED_SYRINGE | INTRAVENOUS | Status: DC | PRN
  Administered 2021-03-09: 20 mg via INTRAVENOUS

## 2021-03-09 MED ORDER — LIDOCAINE 2% (20 MG/ML) 5 ML SYRINGE
INTRAMUSCULAR | Status: AC
  Filled 2021-03-09: qty 5

## 2021-03-09 MED ORDER — PROPOFOL 500 MG/50ML IV EMUL
INTRAVENOUS | Status: AC
  Filled 2021-03-09: qty 50

## 2021-03-09 MED ORDER — MENTHOL 3 MG MT LOZG: 1.0000 | LOZENGE | OROMUCOSAL | Status: DC | PRN

## 2021-03-09 MED ORDER — SODIUM CHLORIDE (PF) 0.9 % IJ SOLN
INTRAMUSCULAR | Status: DC | PRN
  Administered 2021-03-09: 50 mL

## 2021-03-09 MED ORDER — ONDANSETRON HCL 4 MG PO TABS
4.0000 mg | ORAL_TABLET | Freq: Four times a day (QID) | ORAL | Status: DC | PRN
  Administered 2021-03-10: 4 mg via ORAL
  Filled 2021-03-09: qty 1

## 2021-03-09 MED ORDER — SODIUM CHLORIDE 0.9 % IR SOLN
Status: DC | PRN
  Administered 2021-03-09: 1000 mL

## 2021-03-09 MED ORDER — ACETAMINOPHEN 10 MG/ML IV SOLN: 1000.0000 mg | Freq: Once | INTRAVENOUS | Status: DC

## 2021-03-09 MED ORDER — FENTANYL CITRATE (PF) 100 MCG/2ML IJ SOLN
INTRAMUSCULAR | Status: AC
  Filled 2021-03-09: qty 2

## 2021-03-09 MED ORDER — OXYCODONE HCL 5 MG PO TABS
5.0000 mg | ORAL_TABLET | Freq: Once | ORAL | Status: AC | PRN
  Administered 2021-03-09: 5 mg via ORAL

## 2021-03-09 MED ORDER — HYDROCODONE-ACETAMINOPHEN 7.5-325 MG PO TABS
1.0000 | ORAL_TABLET | ORAL | Status: DC | PRN
  Administered 2021-03-10 (×4): 2 via ORAL
  Filled 2021-03-09 (×4): qty 2

## 2021-03-09 MED ORDER — METOCLOPRAMIDE HCL 5 MG PO TABS: 5.0000 mg | ORAL_TABLET | Freq: Three times a day (TID) | ORAL | Status: DC | PRN

## 2021-03-09 MED ORDER — BUPIVACAINE-EPINEPHRINE (PF) 0.25% -1:200000 IJ SOLN
INTRAMUSCULAR | Status: AC
  Filled 2021-03-09: qty 30

## 2021-03-09 MED ORDER — ACETAMINOPHEN 325 MG PO TABS
325.0000 mg | ORAL_TABLET | Freq: Four times a day (QID) | ORAL | Status: DC | PRN
  Administered 2021-03-10: 650 mg via ORAL
  Filled 2021-03-09: qty 2

## 2021-03-09 SURGICAL SUPPLY — 77 items
ADH SKN CLS APL DERMABOND .7 (GAUZE/BANDAGES/DRESSINGS) ×1
APL PRP STRL LF DISP 70% ISPRP (MISCELLANEOUS) ×2
BAG COUNTER SPONGE SURGICOUNT (BAG) IMPLANT
BAG SPEC THK2 15X12 ZIP CLS (MISCELLANEOUS)
BAG SPNG CNTER NS LX DISP (BAG)
BAG ZIPLOCK 12X15 (MISCELLANEOUS) IMPLANT
BATTERY INSTRU NAVIGATION (MISCELLANEOUS) ×6 IMPLANT
BEARING TIBIA INSRT KNEE SZ4 9 (Insert) IMPLANT
BLADE SAW RECIPROCATING 77.5 (BLADE) ×2 IMPLANT
BNDG ELASTIC 4X5.8 VLCR STR LF (GAUZE/BANDAGES/DRESSINGS) ×2 IMPLANT
BNDG ELASTIC 6X5.8 VLCR STR LF (GAUZE/BANDAGES/DRESSINGS) ×2 IMPLANT
BSPLAT TIB 4 KN TRITANIUM (Knees) ×1 IMPLANT
BTRY SRG DRVR LF (MISCELLANEOUS) ×3
CHLORAPREP W/TINT 26 (MISCELLANEOUS) ×4 IMPLANT
COVER SURGICAL LIGHT HANDLE (MISCELLANEOUS) ×2 IMPLANT
CUFF TOURN SGL QUICK 34 (TOURNIQUET CUFF) ×2
CUFF TRNQT CYL 34X4.125X (TOURNIQUET CUFF) ×1 IMPLANT
DECANTER SPIKE VIAL GLASS SM (MISCELLANEOUS) ×4 IMPLANT
DERMABOND ADVANCED (GAUZE/BANDAGES/DRESSINGS) ×1
DERMABOND ADVANCED .7 DNX12 (GAUZE/BANDAGES/DRESSINGS) ×2 IMPLANT
DRAPE SHEET LG 3/4 BI-LAMINATE (DRAPES) ×6 IMPLANT
DRAPE U-SHAPE 47X51 STRL (DRAPES) ×2 IMPLANT
DRSG AQUACEL AG ADV 3.5X10 (GAUZE/BANDAGES/DRESSINGS) ×2 IMPLANT
DRSG TEGADERM 4X4.75 (GAUZE/BANDAGES/DRESSINGS) IMPLANT
ELECT BLADE TIP CTD 4 INCH (ELECTRODE) ×2 IMPLANT
ELECT REM PT RETURN 15FT ADLT (MISCELLANEOUS) ×2 IMPLANT
EVACUATOR 1/8 PVC DRAIN (DRAIN) IMPLANT
FEMORAL RETAINING CRUC KNEE #4 (Knees) ×1 IMPLANT
GAUZE SPONGE 4X4 12PLY STRL (GAUZE/BANDAGES/DRESSINGS) ×2 IMPLANT
GLOVE SRG 8 PF TXTR STRL LF DI (GLOVE) ×2 IMPLANT
GLOVE SURG ENC MOIS LTX SZ8.5 (GLOVE) ×4 IMPLANT
GLOVE SURG ENC TEXT LTX SZ7.5 (GLOVE) ×6 IMPLANT
GLOVE SURG UNDER POLY LF SZ8 (GLOVE) ×4
GLOVE SURG UNDER POLY LF SZ8.5 (GLOVE) ×2 IMPLANT
GOWN SPEC L3 XXLG W/TWL (GOWN DISPOSABLE) ×2 IMPLANT
GOWN SPEC L4 XLG W/TWL (GOWN DISPOSABLE) ×2 IMPLANT
HANDPIECE INTERPULSE COAX TIP (DISPOSABLE) ×2
HOLDER FOLEY CATH W/STRAP (MISCELLANEOUS) ×2 IMPLANT
HOOD PEEL AWAY FLYTE STAYCOOL (MISCELLANEOUS) ×6 IMPLANT
JET LAVAGE IRRISEPT WOUND (IRRIGATION / IRRIGATOR)
KIT TURNOVER KIT A (KITS) ×2 IMPLANT
KNEE PATELLA ASYMMETRIC 10X32 (Knees) ×1 IMPLANT
KNEE TIBIAL COMP TRI SZ4 (Knees) ×1 IMPLANT
LAVAGE JET IRRISEPT WOUND (IRRIGATION / IRRIGATOR) IMPLANT
MARKER SKIN DUAL TIP RULER LAB (MISCELLANEOUS) ×2 IMPLANT
NDL SAFETY ECLIPSE 18X1.5 (NEEDLE) ×1 IMPLANT
NDL SPNL 18GX3.5 QUINCKE PK (NEEDLE) ×1 IMPLANT
NEEDLE HYPO 18GX1.5 SHARP (NEEDLE) ×2
NEEDLE SPNL 18GX3.5 QUINCKE PK (NEEDLE) ×2 IMPLANT
NS IRRIG 1000ML POUR BTL (IV SOLUTION) ×2 IMPLANT
PACK TOTAL KNEE CUSTOM (KITS) ×2 IMPLANT
PADDING CAST COTTON 6X4 STRL (CAST SUPPLIES) ×2 IMPLANT
PENCIL SMOKE EVACUATOR (MISCELLANEOUS) IMPLANT
PIN FLUTED HEDLESS FIX 3.5X1/8 (PIN) ×1 IMPLANT
PROTECTOR NERVE ULNAR (MISCELLANEOUS) ×2 IMPLANT
SAW OSC TIP CART 19.5X105X1.3 (SAW) ×2 IMPLANT
SEALER BIPOLAR AQUA 6.0 (INSTRUMENTS) ×2 IMPLANT
SET HNDPC FAN SPRY TIP SCT (DISPOSABLE) ×1 IMPLANT
SET PAD KNEE POSITIONER (MISCELLANEOUS) ×2 IMPLANT
SPONGE DRAIN TRACH 4X4 STRL 2S (GAUZE/BANDAGES/DRESSINGS) IMPLANT
SUT MNCRL AB 3-0 PS2 18 (SUTURE) ×2 IMPLANT
SUT MNCRL AB 4-0 PS2 18 (SUTURE) ×2 IMPLANT
SUT MON AB 2-0 CT1 36 (SUTURE) ×2 IMPLANT
SUT STRATAFIX PDO 1 14 VIOLET (SUTURE) ×2
SUT STRATFX PDO 1 14 VIOLET (SUTURE) ×1
SUT VIC AB 1 CTX 36 (SUTURE) ×4
SUT VIC AB 1 CTX36XBRD ANBCTR (SUTURE) ×2 IMPLANT
SUT VIC AB 2-0 CT1 27 (SUTURE) ×2
SUT VIC AB 2-0 CT1 TAPERPNT 27 (SUTURE) ×1 IMPLANT
SUTURE STRATFX PDO 1 14 VIOLET (SUTURE) ×1 IMPLANT
SYR 3ML LL SCALE MARK (SYRINGE) ×2 IMPLANT
TIBIA BEAR INSERT KNEE SZ4 9 (Insert) ×2 IMPLANT
TOWER CARTRIDGE SMART MIX (DISPOSABLE) IMPLANT
TRAY FOLEY MTR SLVR 16FR STAT (SET/KITS/TRAYS/PACK) IMPLANT
TUBE SUCTION HIGH CAP CLEAR NV (SUCTIONS) ×2 IMPLANT
WATER STERILE IRR 1000ML POUR (IV SOLUTION) ×4 IMPLANT
WRAP KNEE MAXI GEL POST OP (GAUZE/BANDAGES/DRESSINGS) ×1 IMPLANT

## 2021-03-09 NOTE — Anesthesia Procedure Notes (Signed)
Anesthesia Regional Block: Adductor canal block   Pre-Anesthetic Checklist: , timeout performed,  Correct Patient, Correct Site, Correct Laterality,  Correct Procedure, Correct Position, site marked,  Risks and benefits discussed,  Surgical consent,  Pre-op evaluation,  At surgeon's request and post-op pain management  Laterality: Left  Prep: Maximum Sterile Barrier Precautions used, chloraprep       Needles:  Injection technique: Single-shot  Needle Type: Echogenic Stimulator Needle     Needle Length: 9cm  Needle Gauge: 22     Additional Needles:   Procedures:,,,, ultrasound used (permanent image in chart),,    Narrative:  Start time: 03/09/2021 10:00 AM End time: 03/09/2021 10:05 AM Injection made incrementally with aspirations every 5 mL.  Performed by: Personally  Anesthesiologist: Elmer Picker, MD  Additional Notes: Monitors applied. No increased pain on injection. No increased resistance to injection. Injection made in 5cc increments. Good needle visualization. Patient tolerated procedure well.

## 2021-03-09 NOTE — Interval H&P Note (Signed)
History and Physical Interval Note:  03/09/2021 9:20 AM  Judith Kennedy  has presented today for surgery, with the diagnosis of left knee osteoarthritis.  The various methods of treatment have been discussed with the patient and family. After consideration of risks, benefits and other options for treatment, the patient has consented to  Procedure(s) with comments: COMPUTER ASSISTED TOTAL KNEE ARTHROPLASTY (Left) - as a surgical intervention.  The patient's history has been reviewed, patient examined, no change in status, stable for surgery.  I have reviewed the patient's chart and labs.  Questions were answered to the patient's satisfaction.     [name]

## 2021-03-09 NOTE — Anesthesia Preprocedure Evaluation (Addendum)
Anesthesia Evaluation  Patient identified by MRN, date of birth, ID band Patient awake    Reviewed: Allergy & Precautions, NPO status , Patient's Chart, lab work & pertinent test results  Airway Mallampati: II  TM Distance: >3 FB Neck ROM: Full  Mouth opening: Limited Mouth Opening  Dental  (+) Dental Advisory Given, Chipped,    Pulmonary neg pulmonary ROS,    Pulmonary exam normal breath sounds clear to auscultation       Cardiovascular negative cardio ROS Normal cardiovascular exam Rhythm:Regular Rate:Normal     Neuro/Psych negative neurological ROS  negative psych ROS   GI/Hepatic Neg liver ROS, GERD  ,  Endo/Other  negative endocrine ROS  Renal/GU negative Renal ROS  negative genitourinary   Musculoskeletal  (+) Arthritis ,   Abdominal   Peds  Hematology negative hematology ROS (+)   Anesthesia Other Findings   Reproductive/Obstetrics                            Anesthesia Physical Anesthesia Plan  ASA: 2  Anesthesia Plan: Spinal and Regional   Post-op Pain Management:  Regional for Post-op pain   Induction:   PONV Risk Score and Plan: 2 and Treatment may vary due to age or medical condition, Midazolam, Propofol infusion, Ondansetron and Dexamethasone  Airway Management Planned: Natural Airway  Additional Equipment:   Intra-op Plan:   Post-operative Plan:   Informed Consent: I have reviewed the patients History and Physical, chart, labs and discussed the procedure including the risks, benefits and alternatives for the proposed anesthesia with the patient or authorized representative who has indicated his/her understanding and acceptance.     Dental advisory given  Plan Discussed with: CRNA  Anesthesia Plan Comments:         Anesthesia Quick Evaluation

## 2021-03-09 NOTE — Op Note (Signed)
OPERATIVE REPORT  SURGEON: Rod Can, MD   ASSISTANT: Cherlynn June, PA-C  PREOPERATIVE DIAGNOSIS: Left knee arthritis.   POSTOPERATIVE DIAGNOSIS: Left knee arthritis.   PROCEDURE: Left total knee arthroplasty.   IMPLANTS: Stryker Triathlon CR femur, size 4. Stryker Tritanium tibia, size 4. X3 polyethelyene insert, size 9 mm, CR. 3 button asymmetric patella, size 32 mm.  ANESTHESIA:  MAC, Regional, and Spinal  TOURNIQUET TIME: Not utilized.   ESTIMATED BLOOD LOSS:-100 mL    ANTIBIOTICS: 900 mg clindamycin.  DRAINS: None.  SPECIMENS: Left knee synovium for tissue culture.  COMPLICATIONS: None   CONDITION: PACU - hemodynamically stable.   BRIEF CLINICAL NOTE: Judith Kennedy is a 45 y.o. female with a long-standing history of Left knee arthritis. After failing conservative management, the patient was indicated for total knee arthroplasty. The risks, benefits, and alternatives to the procedure were explained, and the patient elected to proceed.  PROCEDURE IN DETAIL: Adductor canal block was obtained in the pre-op holding area. Once inside the operative room, spinal anesthesia was obtained, and a foley catheter was inserted. The patient was then positioned, a nonsterile tourniquet was placed, and the lower extremity was prepped and draped in the normal sterile surgical fashion.  A time-out was called verifying side and site of surgery. The patient received IV antibiotics within 60 minutes of beginning the procedure. The tourniquet was not utilized.   An anterior approach to the knee was performed utilizing a midvastus arthrotomy. Synovium appears chronically inflamed, so a tissue culture was sent. The synovial fluid was clear. A medial release was performed and the patellar fat pad was excised. Stryker navigation was used to cut the distal femur perpendicular to the mechanical  axis. A freehand patellar resection was performed, and the patella was sized an prepared with 3 lug holes.  Nagivation was used to make a neutral proximal tibia resection, taking 4 mm of bone from the less affected lateral side with 3 degrees of slope. The menisci were excised. A spacer block was placed, and the alignment and balance in extension were confirmed.   The distal femur was sized using the 3-degree external rotation guide referencing the posterior femoral cortex. The appropriate 4-in-1 cutting block was pinned into place. Rotation was checked using Whiteside's line, the epicondylar axis, and then confirmed with a spacer block in flexion. The remaining femoral cuts were performed, taking care to protect the MCL.  The tibia was sized and the trial tray was pinned into place. The remaining trail components were inserted. The knee was stable to varus and valgus stress through a full range of motion. The patella tracked centrally, and the PCL was well balanced. The trial components were removed, and the proximal tibial surface was prepared. Final components were impacted into place. The knee was tested for a final time and found to be well balanced.   The wound was copiously irrigated with Irrisept solution and normal saline using pule lavage.  Marcaine solution was injected into the periarticular soft tissue.  The wound was closed in layers using #1 Vicryl and Stratafix for the fascia, 2-0 Vicryl for the subcutaneous fat, 2-0 Monocryl for the deep dermal layer, 3-0 running Monocryl subcuticular Stitch, and 4-0 Monocryl stay sutures at both ends of the wound. Dermabond was applied to the skin.  Once the glue was fully dried, an Aquacell Ag and compressive dressing were applied.  Tthe patient was transported to the recovery room in stable condition.  Sponge, needle, and instrument counts were correct at  the end of the case x2.  The patient tolerated the procedure well and there were no known  complications.  Please note that a surgical assistant was a medical necessity for this procedure in order to perform it in a safe and expeditious manner. Surgical assistant was necessary to retract the ligaments and vital neurovascular structures to prevent injury to them and also necessary for proper positioning of the limb to allow for anatomic placement of the prosthesis.

## 2021-03-09 NOTE — Discharge Instructions (Signed)
 Dr. Antony Sian Total Joint Specialist [facility] [address] [place], [place] [address] [phone]  TOTAL KNEE REPLACEMENT POSTOPERATIVE DIRECTIONS    Knee Rehabilitation, Guidelines Following Surgery  Results after knee surgery are often greatly improved when you follow the exercise, range of motion and muscle strengthening exercises prescribed by your doctor. Safety measures are also important to protect the knee from further injury. Any time any of these exercises cause you to have increased pain or swelling in your knee joint, decrease the amount until you are comfortable again and slowly increase them. If you have problems or questions, call your caregiver or physical therapist for advice.   WEIGHT BEARING Weight bearing as tolerated with assist device (walker, cane, etc) as directed, use it as long as suggested by your surgeon or therapist, typically at least 4-6 weeks.  HOME CARE INSTRUCTIONS  Remove items at home which could result in a fall. This includes throw rugs or furniture in walking pathways.  Continue medications as instructed at time of discharge. You may have some home medications which will be placed on hold until you complete the course of blood thinner medication.  You may start showering once you are discharged home but do not submerge the incision under water. Just pat the incision dry and apply a dry gauze dressing on daily. Walk with walker as instructed.  You may resume a sexual relationship in one month or when given the OK by your doctor.  Use walker as long as suggested by your caregivers. Avoid periods of inactivity such as sitting longer than an hour when not asleep. This helps prevent blood clots.  You may put full weight on your legs and walk as much as is comfortable.  You may return to work once you are cleared by your doctor.  Do not drive a car for 6 weeks or until released by you surgeon.  Do not drive while  taking narcotics.  Wear the elastic stockings for three weeks following surgery during the day but you may remove then at night. Make sure you keep all of your appointments after your operation with all of your doctors and caregivers. You should call the office at the above phone number and make an appointment for approximately two weeks after the date of your surgery. Do not remove your surgical dressing. The dressing is waterproof; you may take showers in 3 days, but do not take tub baths or submerge the dressing. Please pick up a stool softener and laxative for home use as long as you are requiring pain medications. ICE to the affected knee every three hours for 30 minutes at a time and then as needed for pain and swelling.  Continue to use ice on the knee for pain and swelling from surgery. You may notice swelling that will progress down to the foot and ankle.  This is normal after surgery.  Elevate the leg when you are not up walking on it.   It is important for you to complete the blood thinner medication as prescribed by your doctor. Continue to use the breathing machine which will help keep your temperature down.  It is common for your temperature to cycle up and down following surgery, especially at night when you are not up moving around and exerting yourself.  The breathing machine keeps your lungs expanded and your temperature down.  RANGE OF MOTION AND STRENGTHENING EXERCISES  Rehabilitation of the knee is important following a knee injury or an   operation. After just a few days of immobilization, the muscles of the thigh which control the knee become weakened and shrink (atrophy). Knee exercises are designed to build up the tone and strength of the thigh muscles and to improve knee motion. Often times heat used for twenty to thirty minutes before working out will loosen up your tissues and help with improving the range of motion but do not use heat for the first two weeks following surgery.  These exercises can be done on a training (exercise) mat, on the floor, on a table or on a bed. Use what ever works the best and is most comfortable for you Knee exercises include:  Leg Lifts - While your knee is still immobilized in a splint or cast, you can do straight leg raises. Lift the leg to 60 degrees, hold for 3 sec, and slowly lower the leg. Repeat 10-20 times 2-3 times daily. Perform this exercise against resistance later as your knee gets better.  Quad and Hamstring Sets - Tighten up the muscle on the front of the thigh (Quad) and hold for 5-10 sec. Repeat this 10-20 times hourly. Hamstring sets are done by pushing the foot backward against an object and holding for 5-10 sec. Repeat as with quad sets.  A rehabilitation program following serious knee injuries can speed recovery and prevent re-injury in the future due to weakened muscles. Contact your doctor or a physical therapist for more information on knee rehabilitation.   POST-OPERATIVE OPIOID TAPER INSTRUCTIONS: It is important to wean off of your opioid medication as soon as possible. If you do not need pain medication after your surgery it is ok to stop day one. Opioids include: Codeine, Hydrocodone(Norco, Vicodin), Oxycodone(Percocet, oxycontin) and hydromorphone amongst others.  Long term and even short term use of opiods can cause: Increased pain response Dependence Constipation Depression Respiratory depression And more.  Withdrawal symptoms can include Flu like symptoms Nausea, vomiting And more Techniques to manage these symptoms Hydrate well Eat regular healthy meals Stay active Use relaxation techniques(deep breathing, meditating, yoga) Do Not substitute Alcohol to help with tapering If you have been on opioids for less than two weeks and do not have pain than it is ok to stop all together.  Plan to wean off of opioids This plan should start within one week post op of your joint replacement. Maintain the same  interval or time between taking each dose and first decrease the dose.  Cut the total daily intake of opioids by one tablet each day Next start to increase the time between doses. The last dose that should be eliminated is the evening dose.    SKILLED REHAB INSTRUCTIONS: If the patient is transferred to a skilled rehab facility following release from the hospital, a list of the current medications will be sent to the facility for the patient to continue.  When discharged from the skilled rehab facility, please have the facility set up the patient's Home Health Physical Therapy prior to being released. Also, the skilled facility will be responsible for providing the patient with their medications at time of release from the facility to include their pain medication, the muscle relaxants, and their blood thinner medication. If the patient is still at the rehab facility at time of the two week follow up appointment, the skilled rehab facility will also need to assist the patient in arranging follow up appointment in our office and any transportation needs.  MAKE SURE YOU:  Understand these instructions.  Will watch   your condition.  Will get help right away if you are not doing well or get worse.    Pick up stool softner and laxative for home use following surgery while on pain medications. Do NOT remove your dressing. You may shower.  Do not take tub baths or submerge incision under water. May shower starting three days after surgery. Please use a clean towel to pat the incision dry following showers. Continue to use ice for pain and swelling after surgery. Do not use any lotions or creams on the incision until instructed by your surgeon.  

## 2021-03-09 NOTE — Anesthesia Procedure Notes (Signed)
Spinal  Patient location during procedure: OR Start time: 03/09/2021 10:43 AM End time: 03/09/2021 10:45 AM Reason for block: surgical anesthesia Staffing Performed: resident/CRNA  Anesthesiologist: Elmer Picker, MD Resident/CRNA: Yolonda Kida, CRNA Preanesthetic Checklist Completed: patient identified, IV checked, site marked, risks and benefits discussed, surgical consent, monitors and equipment checked, pre-op evaluation and timeout performed Spinal Block Patient position: sitting Prep: DuraPrep Patient monitoring: heart rate, continuous pulse ox and blood pressure Approach: midline Location: L3-4 Injection technique: single-shot Needle Needle type: Pencan  Needle gauge: 24 G Assessment Sensory level: T6 Events: CSF return

## 2021-03-09 NOTE — Transfer of Care (Signed)
Immediate Anesthesia Transfer of Care Note  Patient: ELFRIDA PIXLEY  Procedure(s) Performed: COMPUTER ASSISTED TOTAL KNEE ARTHROPLASTY (Left: Knee)  Patient Location: PACU  Anesthesia Type:Spinal  Level of Consciousness: awake, alert , oriented and patient cooperative  Airway & Oxygen Therapy: Patient Spontanous Breathing and Patient connected to face mask oxygen  Post-op Assessment: Report given to RN and Post -op Vital signs reviewed and stable  Post vital signs: Reviewed and stable  Last Vitals:  Vitals Value Taken Time  BP 92/66 03/09/21 1301  Temp    Pulse 69 03/09/21 1303  Resp 17 03/09/21 1303  SpO2 100 % 03/09/21 1303  Vitals shown include unvalidated device data.  Last Pain:  Vitals:   03/09/21 1010  TempSrc:   PainSc: 0-No pain      Patients Stated Pain Goal: 3 (03/09/21 0931)  Complications: No notable events documented.

## 2021-03-09 NOTE — Plan of Care (Signed)
  Problem: Activity: Goal: Risk for activity intolerance will decrease Outcome: Progressing   Problem: Pain Managment: Goal: General experience of comfort will improve Outcome: Progressing   Problem: Safety: Goal: Ability to remain free from injury will improve Outcome: Progressing   

## 2021-03-10 ENCOUNTER — Encounter (HOSPITAL_COMMUNITY): Payer: Self-pay | Admitting: Orthopedic Surgery

## 2021-03-10 DIAGNOSIS — M1712 Unilateral primary osteoarthritis, left knee: Secondary | ICD-10-CM | POA: Diagnosis not present

## 2021-03-10 LAB — BASIC METABOLIC PANEL
Anion gap: 7 (ref 5–15)
BUN: 8 mg/dL (ref 6–20)
CO2: 24 mmol/L (ref 22–32)
Calcium: 8.1 mg/dL — ABNORMAL LOW (ref 8.9–10.3)
Chloride: 106 mmol/L (ref 98–111)
Creatinine, Ser: 0.66 mg/dL (ref 0.44–1.00)
GFR, Estimated: >60 mL/min (ref >60)
Glucose, Bld: 116 mg/dL — ABNORMAL HIGH (ref 70–99)
Potassium: 3.7 mmol/L (ref 3.5–5.1)
Sodium: 137 mmol/L (ref 135–145)

## 2021-03-10 LAB — CBC
HCT: 31.7 % — ABNORMAL LOW (ref 36.0–46.0)
Hemoglobin: 9.8 g/dL — ABNORMAL LOW (ref 12.0–15.0)
MCH: 23.8 pg — ABNORMAL LOW (ref 26.0–34.0)
MCHC: 30.9 g/dL (ref 30.0–36.0)
MCV: 77.1 fL — ABNORMAL LOW (ref 80.0–100.0)
Platelets: 499 10*3/uL — ABNORMAL HIGH (ref 150–400)
RBC: 4.11 MIL/uL (ref 3.87–5.11)
RDW: 16.5 % — ABNORMAL HIGH (ref 11.5–15.5)
WBC: 11.5 10*3/uL — ABNORMAL HIGH (ref 4.0–10.5)
nRBC: 0 % (ref 0.0–0.2)

## 2021-03-10 MED ORDER — ONDANSETRON HCL 4 MG PO TABS: 4.0000 mg | ORAL_TABLET | Freq: Four times a day (QID) | ORAL | 0 refills | Status: DC | PRN

## 2021-03-10 MED ORDER — SENNA 8.6 MG PO TABS: 1.0000 | ORAL_TABLET | Freq: Every day | ORAL | 0 refills | Status: DC

## 2021-03-10 MED ORDER — OXYCODONE-ACETAMINOPHEN 5-325 MG PO TABS: 1.0000 | ORAL_TABLET | ORAL | 0 refills | Status: AC | PRN

## 2021-03-10 MED ORDER — ACETAMINOPHEN 325 MG PO TABS: 325.0000 mg | ORAL_TABLET | Freq: Four times a day (QID) | ORAL | Status: DC | PRN

## 2021-03-10 MED ORDER — ASPIRIN 81 MG PO CHEW: 81.0000 mg | CHEWABLE_TABLET | Freq: Two times a day (BID) | ORAL | Status: DC

## 2021-03-10 MED ORDER — DOCUSATE SODIUM 100 MG PO CAPS: 100.0000 mg | ORAL_CAPSULE | Freq: Two times a day (BID) | ORAL | 0 refills | Status: AC

## 2021-03-10 NOTE — Progress Notes (Signed)
    Subjective:  Patient reports pain as mild to moderate.  Denies N/V/CP/SOB.   Objective:   VITALS:   Vitals:   03/09/21 1839 03/09/21 2127 03/10/21 0047 03/10/21 0454  BP: (!) 155/95 140/80 126/86 134/84  Pulse: 69 75 75 78  Resp: 16 16 16 16   Temp: 97.6 F (36.4 C) 97.9 F (36.6 C) 97.8 F (36.6 C) 97.7 F (36.5 C)  TempSrc:  Oral Oral Oral  SpO2: 100% 99% 98% 100%  Weight:      Height:        NAD ABD soft Neurovascular intact Sensation intact distally Intact pulses distally Dorsiflexion/Plantar flexion intact Incision: dressing C/D/I   Lab Results  Component Value Date   WBC 11.5 (H) 03/10/2021   HGB 9.8 (L) 03/10/2021   HCT 31.7 (L) 03/10/2021   MCV 77.1 (L) 03/10/2021   PLT 499 (H) 03/10/2021   BMET    Component Value Date/Time   NA 137 03/10/2021 0331   K 3.7 03/10/2021 0331   CL 106 03/10/2021 0331   CO2 24 03/10/2021 0331   GLUCOSE 116 (H) 03/10/2021 0331   BUN 8 03/10/2021 0331   CREATININE 0.66 03/10/2021 0331   CALCIUM 8.1 (L) 03/10/2021 0331   GFRNONAA >60 03/10/2021 0331     Assessment/Plan: 1 Day Post-Op   Active Problems:   Degenerative arthritis of left knee   WBAT with walker DVT ppx: Aspirin, SCDs, TEDS PO pain control PT/OT Dispo: D/C home once cleared by therapy.       03/12/2021 03/10/2021, 9:03 AM [facility] is now [facility] [address], [address], [place], [place] Phone: [phone] [email or web address] Facebook  [phone]

## 2021-03-10 NOTE — Progress Notes (Signed)
Physical Therapy Treatment Patient Details Name: Judith Kennedy MRN: [id number] DOB: [birth year] Today's Date: 03/10/2021    History of Present Illness Pt s/p L TKR    PT Comments    Pt very cooperative and with marked improvement in activity tolerance and decreased assist needed for all tasks.  Pt up to ambulate in hall progressing to reciprocal gait with RW.  Pt performed HEP with assist and with written instruction provided and reviewed.  Pt eager for dc home this date.  Follow Up Recommendations  Follow surgeon's recommendation for DC plan and follow-up therapies     Equipment Recommendations  None recommended by PT    Recommendations for Other Services       Precautions / Restrictions Precautions Precautions: Fall;Knee Restrictions Weight Bearing Restrictions: No LLE Weight Bearing: Weight bearing as tolerated    Mobility  Bed Mobility Overal bed mobility: Needs Assistance Bed Mobility: Supine to Sit;Sit to Supine     Supine to sit: Min guard Sit to supine: Min guard   General bed mobility comments: increased time with cues for sequence and use of R LE to self assist    Transfers Overall transfer level: Needs assistance Equipment used: Rolling walker (2 wheeled) Transfers: Sit to/from Stand Sit to Stand: Min assist;Min guard         General transfer comment: cues for LE management and use of UEs to self assist  Ambulation/Gait Ambulation/Gait assistance: Min guard;Supervision Gait Distance (Feet): 140 Feet Assistive device: Rolling walker (2 wheeled) Gait Pattern/deviations: Step-to pattern;Decreased step length - right;Decreased step length - left;Shuffle;Trunk flexed Gait velocity: decr   General Gait Details: cues for sequence, posture and position from [employer]             Wheelchair Mobility    Modified Rankin (Stroke Patients Only)       Balance Overall balance assessment: Needs assistance Sitting-balance support: No upper  extremity supported;Feet supported Sitting balance-Leahy Scale: Good     Standing balance support: No upper extremity supported Standing balance-Leahy Scale: Fair                              Cognition Arousal/Alertness: Awake/alert Behavior During Therapy: WFL for tasks assessed/performed Overall Cognitive Status: Within Functional Limits for tasks assessed                                        Exercises Total Joint Exercises Ankle Circles/Pumps: AROM;Both;15 reps;Supine Quad Sets: AROM;Both;10 reps;Supine Heel Slides: AAROM;Left;15 reps;Supine Hip ABduction/ADduction: AAROM;Left;10 reps;Supine Straight Leg Raises: AAROM;Left;15 reps;Supine Long Arc Quad: AAROM;Left;10 reps;Seated    General Comments        Pertinent Vitals/Pain Pain Assessment: 0-10 Pain Score: 4  Pain Location: L knee Pain Descriptors / Indicators: Aching;Sore Pain Intervention(s): Limited activity within patient's tolerance;Monitored during session;Premedicated before session;Ice applied    Home Living                      Prior Function            PT Goals (current goals can now be found in the care plan section) Acute Rehab PT Goals Patient Stated Goal: Regain iND PT Goal Formulation: With patient Time For Goal Achievement: 03/10/21 Potential to Achieve Goals: Good Progress towards PT goals: Progressing toward goals    Frequency  7X/week      PT Plan Current plan remains appropriate    Co-evaluation              AM-PAC PT "6 Clicks" Mobility   Outcome Measure  Help needed turning from your back to your side while in a flat bed without using bedrails?: A Little Help needed moving from lying on your back to sitting on the side of a flat bed without using bedrails?: A Little Help needed moving to and from a bed to a chair (including a wheelchair)?: A Little Help needed standing up from a chair using your arms (e.g., wheelchair or  bedside chair)?: A Little Help needed to walk in hospital room?: A Little Help needed climbing 3-5 steps with a railing? : A Lot 6 Click Score: 17    End of Session Equipment Utilized During Treatment: Gait belt Activity Tolerance: Patient tolerated treatment well Patient left: in bed;with call bell/phone within reach;with family/visitor present Nurse Communication: Mobility status PT Visit Diagnosis: Difficulty in walking, not elsewhere classified (R26.2)     Time: 1751-0258 PT Time Calculation (min) (ACUTE ONLY): 46 min  Charges:  $Gait Training: 8-22 mins $Therapeutic Exercise: 23-37 mins                     Mauro Kaufmann PT Acute Rehabilitation Services Pager [phone] Office [phone]    Nigel Wessman 03/10/2021, 4:17 PM

## 2021-03-10 NOTE — TOC Transition Note (Signed)
Transition of Care [facility]) - CM/SW Discharge Note   Patient Details  Name: Judith Kennedy MRN: [id number] Date of Birth: 1976/03/24  [facility] CM/SW Contact:  Lennart Pall, LCSW Phone Number: 03/10/2021, 11:46 AM   Clinical Narrative:    Met with pt and confirming she has all needed DME at home.  Plan for OPPT at [facility].  No further TOC needs.   Final next level of care: OP Rehab Barriers to Discharge: No Barriers Identified   Patient Goals and CMS Choice Patient states their goals for this hospitalization and ongoing recovery are:: return home      Discharge Placement                       Discharge Plan and Services                DME Arranged: N/A DME Agency: NA                  Social Determinants of Health (SDOH) Interventions     Readmission Risk Interventions No flowsheet data found.

## 2021-03-10 NOTE — Evaluation (Signed)
Physical Therapy Evaluation Patient Details Name: Judith Kennedy MRN: [id number] DOB: [birth year] Today's Date: 03/10/2021   History of Present Illness  Pt s/p L TKR  Clinical Impression  Pt s/p L TKR and presents with decreased L LE strength/ROM, post op pain and anxiety limiting functional mobility.  Pt should progress to dc home with family assist and reports first OP PT 03/14/21.  This session, pt performed ltd therex, ambulated limited distance in room and into bathroom for toileting and hygiene at sink.      Follow Up Recommendations Follow surgeon's recommendation for DC plan and follow-up therapies    Equipment Recommendations  None recommended by PT    Recommendations for Other Services       Precautions / Restrictions Precautions Precautions: Fall;Knee Restrictions Weight Bearing Restrictions: No LLE Weight Bearing: Weight bearing as tolerated      Mobility  Bed Mobility Overal bed mobility: Needs Assistance Bed Mobility: Supine to Sit     Supine to sit: Min assist     General bed mobility comments: increased time with cues for sequence and use of R LE to self assist    Transfers Overall transfer level: Needs assistance Equipment used: Rolling walker (2 wheeled) Transfers: Sit to/from Stand Sit to Stand: Min assist;Mod assist         General transfer comment: cues for LE management and use of UEs to self assist  Ambulation/Gait Ambulation/Gait assistance: Min assist Gait Distance (Feet): 18 Feet (twice) Assistive device: Rolling walker (2 wheeled) Gait Pattern/deviations: Step-to pattern;Decreased step length - right;Decreased step length - left;Shuffle;Trunk flexed Gait velocity: decr   General Gait Details: cues for sequence, posture and position from [employer]            Wheelchair Mobility    Modified Rankin (Stroke Patients Only)       Balance Overall balance assessment: Needs assistance Sitting-balance support: No upper  extremity supported;Feet supported Sitting balance-Leahy Scale: Fair     Standing balance support: Bilateral upper extremity supported Standing balance-Leahy Scale: Poor                               Pertinent Vitals/Pain Pain Assessment: 0-10 Pain Score: 4  Pain Location: L knee Pain Descriptors / Indicators: Aching;Sore Pain Intervention(s): Limited activity within patient's tolerance;Monitored during session;Premedicated before session;Ice applied    Home Living Family/patient expects to be discharged to:: Private residence Living Arrangements: Parent Available Help at Discharge: Family;Available 24 hours/day Type of Home: House Home Access: Level entry     Home Layout: One level Home Equipment: Walker - 2 wheels;Cane - single point;Bedside commode      Prior Function Level of Independence: Independent;Independent with assistive device(s)         Comments: can as needed     Hand Dominance        Extremity/Trunk Assessment   Upper Extremity Assessment Upper Extremity Assessment: Overall WFL for tasks assessed    Lower Extremity Assessment Lower Extremity Assessment: LLE deficits/detail LLE Deficits / Details: 3-/5 quads    Cervical / Trunk Assessment Cervical / Trunk Assessment: Normal  Communication   Communication: No difficulties  Cognition Arousal/Alertness: Awake/alert Behavior During Therapy: WFL for tasks assessed/performed Overall Cognitive Status: Within Functional Limits for tasks assessed  General Comments      Exercises Total Joint Exercises Ankle Circles/Pumps: AROM;Both;15 reps;Supine Quad Sets: AROM;Both;10 reps;Supine   Assessment/Plan    PT Assessment Patient needs continued PT services  PT Problem List Decreased strength;Decreased range of motion;Decreased activity tolerance;Decreased balance;Decreased knowledge of use of DME;Pain       PT Treatment  Interventions DME instruction;Gait training;Stair training;Functional mobility training;Therapeutic activities;Therapeutic exercise;Patient/family education;Balance training    PT Goals (Current goals can be found in the Care Plan section)  Acute Rehab PT Goals Patient Stated Goal: Regain iND PT Goal Formulation: With patient Time For Goal Achievement: 03/10/21 Potential to Achieve Goals: Good    Frequency 7X/week   Barriers to discharge        Co-evaluation               AM-PAC PT "6 Clicks" Mobility  Outcome Measure Help needed turning from your back to your side while in a flat bed without using bedrails?: A Little Help needed moving from lying on your back to sitting on the side of a flat bed without using bedrails?: A Little Help needed moving to and from a bed to a chair (including a wheelchair)?: A Little Help needed standing up from a chair using your arms (e.g., wheelchair or bedside chair)?: A Little Help needed to walk in hospital room?: A Little Help needed climbing 3-5 steps with a railing? : A Lot 6 Click Score: 17    End of Session Equipment Utilized During Treatment: Gait belt Activity Tolerance: Patient limited by fatigue;Patient limited by pain Patient left: in chair;with call bell/phone within reach;with chair alarm set Nurse Communication: Mobility status PT Visit Diagnosis: Difficulty in walking, not elsewhere classified (R26.2)    Time: 1027-2536 PT Time Calculation (min) (ACUTE ONLY): 42 min   Charges:   PT Evaluation $PT Eval Low Complexity: 1 Low PT Treatments $Gait Training: 8-22 mins $Therapeutic Activity: 8-22 mins        Mauro Kaufmann PT Acute Rehabilitation Services Pager [phone] Office [phone]   Ryott Rafferty 03/10/2021, 1:10 PM

## 2021-03-10 NOTE — Anesthesia Postprocedure Evaluation (Signed)
Anesthesia Post Note  Patient: Judith Kennedy  Procedure(s) Performed: COMPUTER ASSISTED TOTAL KNEE ARTHROPLASTY (Left: Knee)     Patient location during evaluation: PACU Anesthesia Type: Regional and Spinal Level of consciousness: oriented and awake and alert Pain management: pain level controlled Vital Signs Assessment: post-procedure vital signs reviewed and stable Respiratory status: spontaneous breathing, respiratory function stable and patient connected to nasal cannula oxygen Cardiovascular status: blood pressure returned to baseline and stable Postop Assessment: no headache, no backache and no apparent nausea or vomiting Anesthetic complications: no   No notable events documented.  Last Vitals:  Vitals:   03/10/21 0047 03/10/21 0454  BP: 126/86 134/84  Pulse: 75 78  Resp: 16 16  Temp: 36.6 C 36.5 C  SpO2: 98% 100%    Last Pain:  Vitals:   03/10/21 1025  TempSrc:   PainSc: 10-Worst pain ever                 Shekia Kuper L Jevante Hollibaugh

## 2021-03-14 LAB — AEROBIC/ANAEROBIC CULTURE W GRAM STAIN (SURGICAL/DEEP WOUND): Culture: NO GROWTH

## 2021-04-03 ENCOUNTER — Encounter (HOSPITAL_COMMUNITY): Payer: Self-pay | Admitting: Orthopedic Surgery

## 2021-04-03 MED ORDER — IRRISEPT - 450ML BOTTLE WITH 0.05% CHG IN STERILE WATER, USP 99.95% OPTIME
TOPICAL | Status: AC | PRN
  Administered 2021-03-09: 450 mL

## 2021-05-01 ENCOUNTER — Ambulatory Visit: Payer: Self-pay | Admitting: Student

## 2021-05-25 NOTE — Patient Instructions (Addendum)
DUE TO COVID-19 ONLY ONE VISITOR IS ALLOWED TO COME WITH YOU AND STAY IN THE WAITING ROOM ONLY DURING PRE OP AND PROCEDURE.   **NO VISITORS ARE ALLOWED IN THE SHORT STAY AREA OR RECOVERY ROOM!!**  IF YOU WILL BE ADMITTED INTO THE HOSPITAL YOU ARE ALLOWED ONLY TWO SUPPORT PEOPLE DURING VISITATION HOURS ONLY (10AM -8PM)   The support person(s) may change daily. The support person(s) must pass our screening, gel in and out, and wear a mask at all times, including in the patient's room. Patients must also wear a mask when staff or their support person are in the room.  No visitors under the age of 90. Any visitor under the age of 73 must be accompanied by an adult.    COVID SWAB TESTING MUST BE COMPLETED ON:  06/06/21 **MUST PRESENT COMPLETED FORM AT TESTING SITE**    [address] [place] (backside of the building) No appointment needed. Open 8am-3pm You are not required to quarantine, however you are required to wear a well-fitted mask when you are out and around people not in your household.  Hand Hygiene often Do NOT share personal items Notify your provider if you are in close contact with someone who has COVID or you develop fever 100.4 or greater, new onset of sneezing, cough, sore throat, shortness of breath or body aches.       Your procedure is scheduled on: 06/08/21   Report to [facility] Main Entrance    Report to admitting at 10:45 AM   Call this number if you have problems the morning of surgery [phone]   Do not eat food :After Midnight.   May have liquids until 10:30 AM day of surgery  CLEAR LIQUID DIET  Foods Allowed                                                                     Foods Excluded  Water, Black Coffee and tea (no milk or creamer)           liquids that you cannot  Plain Jell-O in any flavor  (No red)                                    see through such as: Fruit ices (not with fruit pulp)                                             milk, soups, orange juice              Iced Popsicles (No red)                                               All solid food  Apple juices Sports drinks like Gatorade (No red) Lightly seasoned clear broth or consume(fat free) Sugar     The day of surgery:  Drink ONE (1) Pre-Surgery Clear Ensure by 10:30 am the morning of surgery. Drink in one sitting. Do not sip.  This drink was given to you during your hospital  pre-op appointment visit. Nothing else to drink after completing the  Pre-Surgery Clear Ensure.          If you have questions, please contact your surgeon's office.     Oral Hygiene is also important to reduce your risk of infection.                                    Remember - BRUSH YOUR TEETH THE MORNING OF SURGERY WITH YOUR REGULAR TOOTHPASTE   Take these medicines the morning of surgery with A SIP OF WATER: Tylenol, Prednisone.                               You may not have any metal on your body including hair pins, jewelry, and body piercing             Do not wear make-up, lotions, powders, perfumes, or deodorant  Do not wear nail polish including gel and S&S, artificial/acrylic nails, or any other type of covering on natural nails including finger and toenails. If you have artificial nails, gel coating, etc. that needs to be removed by a nail salon please have this removed prior to surgery or surgery may need to be canceled/ delayed if the surgeon/ anesthesia feels like they are unable to be safely monitored.   Do not shave  48 hours prior to surgery.               Do not bring valuables to the hospital. [facility] IS NOT             RESPONSIBLE   FOR VALUABLES.   Bring small overnight bag day of surgery.    Please read over the following fact sheets you were given: IF YOU HAVE QUESTIONS ABOUT YOUR PRE-OP INSTRUCTIONS PLEASE CALL [phone]- [facility] - Preparing for Surgery Before surgery, you can play an  important role.  Because skin is not sterile, your skin needs to be as free of germs as possible.  You can reduce the number of germs on your skin by washing with CHG (chlorahexidine gluconate) soap before surgery.  CHG is an antiseptic cleaner which kills germs and bonds with the skin to continue killing germs even after washing. Please DO NOT use if you have an allergy to CHG or antibacterial soaps.  If your skin becomes reddened/irritated stop using the CHG and inform your nurse when you arrive at Short Stay. Do not shave (including legs and underarms) for at least 48 hours prior to the first CHG shower.  You may shave your face/neck.  Please follow these instructions carefully:  1.  Shower with CHG Soap the night before surgery and the  morning of surgery.  2.  If you choose to wash your hair, wash your hair first as usual with your normal  shampoo.  3.  After you shampoo, rinse your hair and body thoroughly to remove the shampoo.  4.  Use CHG as you would any other liquid soap.  You can apply chg directly to the skin and wash.  Gently with a scrungie or clean washcloth.  5.  Apply the CHG Soap to your body ONLY FROM THE NECK DOWN.   Do   not use on face/ open                           Wound or open sores. Avoid contact with eyes, ears mouth and   genitals (private parts).                       Wash face,  Genitals (private parts) with your normal soap.             6.  Wash thoroughly, paying special attention to the area where your    surgery  will be performed.  7.  Thoroughly rinse your body with warm water from the neck down.  8.  DO NOT shower/wash with your normal soap after using and rinsing off the CHG Soap.                9.  Pat yourself dry with a clean towel.            10.  Wear clean pajamas.            11.  Place clean sheets on your bed the night of your first shower and do not  sleep with pets. Day of Surgery : Do not apply any lotions/deodorants the  morning of surgery.  Please wear clean clothes to the hospital/surgery center.  FAILURE TO FOLLOW THESE INSTRUCTIONS MAY RESULT IN THE CANCELLATION OF YOUR SURGERY  PATIENT SIGNATURE_________________________________  NURSE SIGNATURE__________________________________  ________________________________________________________________________   Judith Kennedy  An incentive spirometer is a tool that can help keep your lungs clear and active. This tool measures how well you are filling your lungs with each breath. Taking long deep breaths may help reverse or decrease the chance of developing breathing (pulmonary) problems (especially infection) following: A long period of time when you are unable to move or be active. BEFORE THE PROCEDURE  If the spirometer includes an indicator to show your best effort, your nurse or respiratory therapist will set it to a desired goal. If possible, sit up straight or lean slightly forward. Try not to slouch. Hold the incentive spirometer in an upright position. INSTRUCTIONS FOR USE  Sit on the edge of your bed if possible, or sit up as far as you can in bed or on a chair. Hold the incentive spirometer in an upright position. Breathe out normally. Place the mouthpiece in your mouth and seal your lips tightly around it. Breathe in slowly and as deeply as possible, raising the piston or the ball toward the top of the column. Hold your breath for 3-5 seconds or for as long as possible. Allow the piston or ball to fall to the bottom of the column. Remove the mouthpiece from your mouth and breathe out normally. Rest for a few seconds and repeat Steps 1 through 7 at least 10 times every 1-2 hours when you are awake. Take your time and take a few normal breaths between deep breaths. The spirometer may include an indicator to show your best effort. Use the indicator as a goal to work toward during each repetition. After each set of 10 deep breaths, practice  coughing to be sure  your lungs are clear. If you have an incision (the cut made at the time of surgery), support your incision when coughing by placing a pillow or rolled up towels firmly against it. Once you are able to get out of bed, walk around indoors and cough well. You may stop using the incentive spirometer when instructed by your caregiver.  RISKS AND COMPLICATIONS Take your time so you do not get dizzy or light-headed. If you are in pain, you may need to take or ask for pain medication before doing incentive spirometry. It is harder to take a deep breath if you are having pain. AFTER USE Rest and breathe slowly and easily. It can be helpful to keep track of a log of your progress. Your caregiver can provide you with a simple table to help with this. If you are using the spirometer at home, follow these instructions: [place] IF:  You are having difficultly using the spirometer. You have trouble using the spirometer as often as instructed. Your pain medication is not giving enough relief while using the spirometer. You develop fever of 100.5 F (38.1 C) or higher. SEEK IMMEDIATE MEDICAL CARE IF:  You cough up bloody sputum that had not been present before. You develop fever of 102 F (38.9 C) or greater. You develop worsening pain at or near the incision site. MAKE SURE YOU:  Understand these instructions. Will watch your condition. Will get help right away if you are not doing well or get worse. Document Released: 12/10/2006 Document Revised: 10/22/2011 Document Reviewed: 02/10/2007 Exit[employer] Patient Information 2014 [employer], [place].   ________________________________________________________________________  WHAT IS A BLOOD TRANSFUSION? Blood Transfusion Information  A transfusion is the replacement of blood or some of its parts. Blood is made up of multiple cells which provide different functions. Red blood cells carry oxygen and are used for blood loss  replacement. White blood cells fight against infection. Platelets control bleeding. Plasma helps clot blood. Other blood products are available for specialized needs, such as hemophilia or other clotting disorders. BEFORE THE TRANSFUSION  Who gives blood for transfusions?  Healthy volunteers who are fully evaluated to make sure their blood is safe. This is blood bank blood. Transfusion therapy is the safest it has ever been in the practice of medicine. Before blood is taken from a donor, a complete history is taken to make sure that person has no history of diseases nor engages in risky social behavior (examples are intravenous drug use or sexual activity with multiple partners). The donor's travel history is screened to minimize risk of transmitting infections, such as malaria. The donated blood is tested for signs of infectious diseases, such as HIV and hepatitis. The blood is then tested to be sure it is compatible with you in order to minimize the chance of a transfusion reaction. If you or a relative donates blood, this is often done in anticipation of surgery and is not appropriate for emergency situations. It takes many days to process the donated blood. RISKS AND COMPLICATIONS Although transfusion therapy is very safe and saves many lives, the main dangers of transfusion include:  Getting an infectious disease. Developing a transfusion reaction. This is an allergic reaction to something in the blood you were given. Every precaution is taken to prevent this. The decision to have a blood transfusion has been considered carefully by your caregiver before blood is given. Blood is not given unless the benefits outweigh the risks. AFTER THE TRANSFUSION Right after receiving a blood transfusion,  you will usually feel much better and more energetic. This is especially true if your red blood cells have gotten low (anemic). The transfusion raises the level of the red blood cells which carry oxygen, and  this usually causes an energy increase. The nurse administering the transfusion will monitor you carefully for complications. HOME CARE INSTRUCTIONS  No special instructions are needed after a transfusion. You may find your energy is better. Speak with your caregiver about any limitations on activity for underlying diseases you may have. SEEK MEDICAL CARE IF:  Your condition is not improving after your transfusion. You develop redness or irritation at the intravenous (IV) site. SEEK IMMEDIATE MEDICAL CARE IF:  Any of the following symptoms occur over the next 12 hours: Shaking chills. You have a temperature by mouth above 102 F (38.9 C), not controlled by medicine. Chest, back, or muscle pain. People around you feel you are not acting correctly or are confused. Shortness of breath or difficulty breathing. Dizziness and fainting. You get a rash or develop hives. You have a decrease in urine output. Your urine turns a dark color or changes to pink, red, or brown. Any of the following symptoms occur over the next 10 days: You have a temperature by mouth above 102 F (38.9 C), not controlled by medicine. Shortness of breath. Weakness after normal activity. The white part of the eye turns yellow (jaundice). You have a decrease in the amount of urine or are urinating less often. Your urine turns a dark color or changes to pink, red, or brown. Document Released: 07/27/2000 Document Revised: 10/22/2011 Document Reviewed: 03/15/2008 [facility] Patient Information 2014 [place], [place].  _______________________________________________________________________

## 2021-05-25 NOTE — Progress Notes (Addendum)
COVID swab appointment:06/06/21  COVID Vaccine Completed: yes x3 Date COVID Vaccine completed: Has received booster: COVID vaccine manufacturer:    Moderna     Date of COVID positive in last 90 days: no  PCP - Coralee Rud, DO Cardiologist - n/a  Chest x-ray - n/a EKG - n/a Stress Test - n/a ECHO - n/a Cardiac Cath - n/a Pacemaker/ICD device last checked: n/a Spinal Cord Stimulator: n/a  Sleep Study - n/a CPAP -   Fasting Blood Sugar - n/a Checks Blood Sugar _____ times a day  Blood Thinner Instructions: n/a Aspirin Instructions: Last Dose:  Activity level: Can go up a flight of stairs and perform activities of daily living without stopping and without symptoms of chest pain or shortness of breath.     Anesthesia review:   Patient denies shortness of breath, fever, cough and chest pain at PAT appointment   Patient verbalized understanding of instructions that were given to them at the PAT appointment. Patient was also instructed that they will need to review over the PAT instructions again at home before surgery.

## 2021-05-26 ENCOUNTER — Other Ambulatory Visit: Payer: Self-pay

## 2021-05-26 ENCOUNTER — Encounter (HOSPITAL_COMMUNITY): Payer: Self-pay

## 2021-05-26 ENCOUNTER — Encounter (HOSPITAL_COMMUNITY)
Admission: RE | Admit: 2021-05-26 | Discharge: 2021-05-26 | Disposition: A | Discharge status: Home / Self Care | Payer: No Typology Code available for payment source | Source: Ambulatory Visit | Attending: Orthopedic Surgery | Admitting: Orthopedic Surgery

## 2021-05-26 DIAGNOSIS — Z01812 Encounter for preprocedural laboratory examination: Secondary | ICD-10-CM | POA: Insufficient documentation

## 2021-05-26 LAB — COMPREHENSIVE METABOLIC PANEL
ALT: 14 U/L (ref 0–44)
AST: 17 U/L (ref 15–41)
Albumin: 4 g/dL (ref 3.5–5.0)
Alkaline Phosphatase: 78 U/L (ref 38–126)
Anion gap: 7 (ref 5–15)
BUN: 13 mg/dL (ref 6–20)
CO2: 28 mmol/L (ref 22–32)
Calcium: 8.7 mg/dL — ABNORMAL LOW (ref 8.9–10.3)
Chloride: 102 mmol/L (ref 98–111)
Creatinine, Ser: 0.91 mg/dL (ref 0.44–1.00)
GFR, Estimated: >60 mL/min (ref >60)
Glucose, Bld: 122 mg/dL — ABNORMAL HIGH (ref 70–99)
Potassium: 4.1 mmol/L (ref 3.5–5.1)
Sodium: 137 mmol/L (ref 135–145)
Total Bilirubin: 0.4 mg/dL (ref 0.3–1.2)
Total Protein: 7.6 g/dL (ref 6.5–8.1)

## 2021-05-26 LAB — CBC
HCT: 38.9 % (ref 36.0–46.0)
Hemoglobin: 12 g/dL (ref 12.0–15.0)
MCH: 24.3 pg — ABNORMAL LOW (ref 26.0–34.0)
MCHC: 30.8 g/dL (ref 30.0–36.0)
MCV: 78.7 fL — ABNORMAL LOW (ref 80.0–100.0)
Platelets: 580 10*3/uL — ABNORMAL HIGH (ref 150–400)
RBC: 4.94 MIL/uL (ref 3.87–5.11)
RDW: 19.4 % — ABNORMAL HIGH (ref 11.5–15.5)
WBC: 14.2 10*3/uL — ABNORMAL HIGH (ref 4.0–10.5)
nRBC: 0 % (ref 0.0–0.2)

## 2021-05-26 LAB — TYPE AND SCREEN
ABO/RH(D): A POS
Antibody Screen: NEGATIVE

## 2021-05-26 LAB — PROTIME-INR
INR: 1 (ref 0.8–1.2)
Prothrombin Time: 13.1 seconds (ref 11.4–15.2)

## 2021-05-26 LAB — SURGICAL PCR SCREEN
MRSA, PCR: NEGATIVE
Staphylococcus aureus: NEGATIVE

## 2021-06-06 ENCOUNTER — Other Ambulatory Visit: Payer: Self-pay | Admitting: Orthopedic Surgery

## 2021-06-07 LAB — SARS CORONAVIRUS 2 (TAT 6-24 HRS): SARS Coronavirus 2: NEGATIVE

## 2021-06-08 ENCOUNTER — Encounter (HOSPITAL_COMMUNITY)
Admission: RE | Disposition: A | Discharge status: Home / Self Care | Payer: Self-pay | Source: Home / Self Care | Attending: Orthopedic Surgery

## 2021-06-08 ENCOUNTER — Ambulatory Visit (HOSPITAL_COMMUNITY)
Admission: RE | Admit: 2021-06-08 | Discharge: 2021-06-09 | Disposition: A | Discharge status: Home / Self Care | Payer: PRIVATE HEALTH INSURANCE | Attending: Orthopedic Surgery | Admitting: Orthopedic Surgery

## 2021-06-08 ENCOUNTER — Other Ambulatory Visit: Payer: Self-pay

## 2021-06-08 ENCOUNTER — Ambulatory Visit (HOSPITAL_COMMUNITY): Payer: PRIVATE HEALTH INSURANCE

## 2021-06-08 ENCOUNTER — Ambulatory Visit (HOSPITAL_COMMUNITY): Payer: PRIVATE HEALTH INSURANCE | Admitting: Anesthesiology

## 2021-06-08 ENCOUNTER — Encounter (HOSPITAL_COMMUNITY): Payer: Self-pay | Admitting: Orthopedic Surgery

## 2021-06-08 DIAGNOSIS — Z96651 Presence of right artificial knee joint: Secondary | ICD-10-CM

## 2021-06-08 DIAGNOSIS — Z880 Allergy status to penicillin: Secondary | ICD-10-CM | POA: Diagnosis not present

## 2021-06-08 DIAGNOSIS — Z96652 Presence of left artificial knee joint: Secondary | ICD-10-CM | POA: Diagnosis not present

## 2021-06-08 DIAGNOSIS — M1711 Unilateral primary osteoarthritis, right knee: Secondary | ICD-10-CM | POA: Insufficient documentation

## 2021-06-08 HISTORY — PX: KNEE ARTHROPLASTY: SHX992

## 2021-06-08 LAB — PREGNANCY, URINE: Preg Test, Ur: NEGATIVE

## 2021-06-08 SURGERY — ARTHROPLASTY, KNEE, TOTAL, USING IMAGELESS COMPUTER-ASSISTED NAVIGATION
Anesthesia: Monitor Anesthesia Care | Site: Knee | Laterality: Right

## 2021-06-08 MED ORDER — KETOROLAC TROMETHAMINE 30 MG/ML IJ SOLN
INTRAMUSCULAR | Status: DC | PRN
  Administered 2021-06-08: 30 mg via INTRA_ARTICULAR

## 2021-06-08 MED ORDER — PHENOL 1.4 % MT LIQD: 1.0000 | OROMUCOSAL | Status: DC | PRN

## 2021-06-08 MED ORDER — DEXAMETHASONE SODIUM PHOSPHATE 10 MG/ML IJ SOLN
INTRAMUSCULAR | Status: DC | PRN
  Administered 2021-06-08: 10 mg via INTRAVENOUS

## 2021-06-08 MED ORDER — ORAL CARE MOUTH RINSE: 15.0000 mL | Freq: Once | OROMUCOSAL | Status: AC

## 2021-06-08 MED ORDER — OXYCODONE HCL 5 MG PO TABS
10.0000 mg | ORAL_TABLET | ORAL | Status: DC | PRN
  Administered 2021-06-09: 10 mg via ORAL

## 2021-06-08 MED ORDER — SODIUM CHLORIDE 0.9 % IR SOLN
Status: DC | PRN
  Administered 2021-06-08: 1000 mL

## 2021-06-08 MED ORDER — POVIDONE-IODINE 10 % EX SWAB
2.0000 "application " | Freq: Once | CUTANEOUS | Status: AC
  Administered 2021-06-08: 2 via TOPICAL

## 2021-06-08 MED ORDER — DIPHENHYDRAMINE HCL 12.5 MG/5ML PO ELIX: 12.5000 mg | ORAL_SOLUTION | ORAL | Status: DC | PRN

## 2021-06-08 MED ORDER — OXYCODONE HCL ER 10 MG PO T12A
10.0000 mg | EXTENDED_RELEASE_TABLET | Freq: Two times a day (BID) | ORAL | Status: DC
  Administered 2021-06-08: 10 mg via ORAL
  Filled 2021-06-08: qty 1

## 2021-06-08 MED ORDER — POVIDONE-IODINE 10 % EX SWAB: 2.0000 "application " | Freq: Once | CUTANEOUS | Status: DC

## 2021-06-08 MED ORDER — CLINDAMYCIN PHOSPHATE 900 MG/50ML IV SOLN
900.0000 mg | INTRAVENOUS | Status: AC
Start: 2021-06-08 — End: 2021-06-08
  Administered 2021-06-08: 900 mg via INTRAVENOUS
  Filled 2021-06-08: qty 50

## 2021-06-08 MED ORDER — ONDANSETRON HCL 4 MG PO TABS
4.0000 mg | ORAL_TABLET | Freq: Four times a day (QID) | ORAL | Status: DC | PRN
  Administered 2021-06-09: 4 mg via ORAL
  Filled 2021-06-08: qty 1

## 2021-06-08 MED ORDER — METOCLOPRAMIDE HCL 5 MG PO TABS: 5.0000 mg | ORAL_TABLET | Freq: Three times a day (TID) | ORAL | Status: DC | PRN

## 2021-06-08 MED ORDER — FOLIC ACID 1 MG PO TABS
1.0000 mg | ORAL_TABLET | Freq: Every day | ORAL | Status: DC
  Administered 2021-06-09: 1 mg via ORAL
  Filled 2021-06-08: qty 1

## 2021-06-08 MED ORDER — SENNA 8.6 MG PO TABS
1.0000 | ORAL_TABLET | Freq: Two times a day (BID) | ORAL | Status: DC
  Administered 2021-06-08 – 2021-06-09 (×2): 8.6 mg via ORAL
  Filled 2021-06-08 (×2): qty 1

## 2021-06-08 MED ORDER — PROPOFOL 500 MG/50ML IV EMUL
INTRAVENOUS | Status: DC | PRN
  Administered 2021-06-08: 100 ug/kg/min via INTRAVENOUS
  Administered 2021-06-08: 80 ug/kg/min via INTRAVENOUS

## 2021-06-08 MED ORDER — BUPIVACAINE-EPINEPHRINE (PF) 0.25% -1:200000 IJ SOLN
INTRAMUSCULAR | Status: AC
  Filled 2021-06-08: qty 30

## 2021-06-08 MED ORDER — SODIUM CHLORIDE 0.9 % IV SOLN: INTRAVENOUS | Status: DC

## 2021-06-08 MED ORDER — BUPIVACAINE-EPINEPHRINE 0.25% -1:200000 IJ SOLN
INTRAMUSCULAR | Status: DC | PRN
  Administered 2021-06-08: 30 mL

## 2021-06-08 MED ORDER — CLINDAMYCIN PHOSPHATE 600 MG/50ML IV SOLN
600.0000 mg | Freq: Four times a day (QID) | INTRAVENOUS | Status: AC
  Administered 2021-06-08 – 2021-06-09 (×2): 600 mg via INTRAVENOUS
  Filled 2021-06-08 (×2): qty 50

## 2021-06-08 MED ORDER — ACETAMINOPHEN 10 MG/ML IV SOLN
1000.0000 mg | Freq: Once | INTRAVENOUS | Status: DC
  Filled 2021-06-08: qty 100

## 2021-06-08 MED ORDER — ACETAMINOPHEN 325 MG PO TABS: 325.0000 mg | ORAL_TABLET | Freq: Four times a day (QID) | ORAL | Status: DC | PRN

## 2021-06-08 MED ORDER — SORBITOL 70 % SOLN
30.0000 mL | Freq: Every day | Status: DC | PRN
  Filled 2021-06-08: qty 30

## 2021-06-08 MED ORDER — GLYCOPYRROLATE PF 0.2 MG/ML IJ SOSY
PREFILLED_SYRINGE | INTRAMUSCULAR | Status: DC | PRN
  Administered 2021-06-08: .2 mg via INTRAVENOUS

## 2021-06-08 MED ORDER — POLYETHYLENE GLYCOL 3350 17 G PO PACK: 17.0000 g | PACK | Freq: Every day | ORAL | Status: DC | PRN

## 2021-06-08 MED ORDER — METHOCARBAMOL 500 MG PO TABS: 500.0000 mg | ORAL_TABLET | Freq: Four times a day (QID) | ORAL | Status: DC | PRN

## 2021-06-08 MED ORDER — FENTANYL CITRATE (PF) 100 MCG/2ML IJ SOLN
INTRAMUSCULAR | Status: AC
  Filled 2021-06-08: qty 2

## 2021-06-08 MED ORDER — KETOROLAC TROMETHAMINE 30 MG/ML IJ SOLN
INTRAMUSCULAR | Status: AC
  Filled 2021-06-08: qty 1

## 2021-06-08 MED ORDER — PROPOFOL 10 MG/ML IV BOLUS
INTRAVENOUS | Status: DC | PRN
  Administered 2021-06-08: 40 mg via INTRAVENOUS
  Administered 2021-06-08: 20 mg via INTRAVENOUS
  Administered 2021-06-08: 100 mg via INTRAVENOUS

## 2021-06-08 MED ORDER — FLEET ENEMA 7-19 GM/118ML RE ENEM: 1.0000 | ENEMA | Freq: Once | RECTAL | Status: DC | PRN

## 2021-06-08 MED ORDER — MENTHOL 3 MG MT LOZG: 1.0000 | LOZENGE | OROMUCOSAL | Status: DC | PRN

## 2021-06-08 MED ORDER — LACTATED RINGERS IV SOLN
INTRAVENOUS | Status: DC
Start: 2021-06-08 — End: 2021-06-08

## 2021-06-08 MED ORDER — SODIUM CHLORIDE (PF) 0.9 % IJ SOLN
INTRAMUSCULAR | Status: DC | PRN
  Administered 2021-06-08: 30 mL

## 2021-06-08 MED ORDER — CHLORHEXIDINE GLUCONATE 0.12 % MT SOLN
15.0000 mL | Freq: Once | OROMUCOSAL | Status: AC
  Administered 2021-06-08: 15 mL via OROMUCOSAL

## 2021-06-08 MED ORDER — OXYCODONE HCL 5 MG PO TABS
5.0000 mg | ORAL_TABLET | ORAL | Status: DC | PRN
Start: 2021-06-08 — End: 2021-06-09
  Administered 2021-06-08 – 2021-06-09 (×2): 10 mg via ORAL
  Filled 2021-06-08 (×3): qty 2

## 2021-06-08 MED ORDER — 0.9 % SODIUM CHLORIDE (POUR BTL) OPTIME
TOPICAL | Status: DC | PRN
  Administered 2021-06-08: 1000 mL

## 2021-06-08 MED ORDER — VANCOMYCIN HCL IN DEXTROSE 1-5 GM/200ML-% IV SOLN
1000.0000 mg | INTRAVENOUS | Status: AC
  Administered 2021-06-08: 1000 mg via INTRAVENOUS
  Filled 2021-06-08: qty 200

## 2021-06-08 MED ORDER — DEXAMETHASONE SODIUM PHOSPHATE 10 MG/ML IJ SOLN
10.0000 mg | Freq: Once | INTRAMUSCULAR | Status: AC
  Administered 2021-06-09: 10 mg via INTRAVENOUS
  Filled 2021-06-08: qty 1

## 2021-06-08 MED ORDER — FENTANYL CITRATE PF 50 MCG/ML IJ SOSY: 25.0000 ug | PREFILLED_SYRINGE | INTRAMUSCULAR | Status: DC | PRN

## 2021-06-08 MED ORDER — TRANEXAMIC ACID-NACL 1000-0.7 MG/100ML-% IV SOLN
1000.0000 mg | INTRAVENOUS | Status: AC
  Administered 2021-06-08: 1000 mg via INTRAVENOUS
  Filled 2021-06-08: qty 100

## 2021-06-08 MED ORDER — BUPIVACAINE IN DEXTROSE 0.75-8.25 % IT SOLN
INTRATHECAL | Status: DC | PRN
  Administered 2021-06-08: 1.6 mL via INTRATHECAL

## 2021-06-08 MED ORDER — METHOCARBAMOL 500 MG IVPB - SIMPLE MED
500.0000 mg | Freq: Four times a day (QID) | INTRAVENOUS | Status: DC | PRN
  Filled 2021-06-08: qty 50

## 2021-06-08 MED ORDER — CLONIDINE HCL (ANALGESIA) 100 MCG/ML EP SOLN
EPIDURAL | Status: DC | PRN
  Administered 2021-06-08: 100 ug

## 2021-06-08 MED ORDER — DOCUSATE SODIUM 100 MG PO CAPS
100.0000 mg | ORAL_CAPSULE | Freq: Two times a day (BID) | ORAL | Status: DC
  Administered 2021-06-08 – 2021-06-09 (×2): 100 mg via ORAL
  Filled 2021-06-08 (×2): qty 1

## 2021-06-08 MED ORDER — FENTANYL CITRATE (PF) 100 MCG/2ML IJ SOLN
INTRAMUSCULAR | Status: DC | PRN
  Administered 2021-06-08: 50 ug via INTRAVENOUS

## 2021-06-08 MED ORDER — METOCLOPRAMIDE HCL 5 MG/ML IJ SOLN
5.0000 mg | Freq: Three times a day (TID) | INTRAMUSCULAR | Status: DC | PRN
  Administered 2021-06-08: 10 mg via INTRAVENOUS
  Filled 2021-06-08: qty 2

## 2021-06-08 MED ORDER — ROPIVACAINE HCL 5 MG/ML IJ SOLN
INTRAMUSCULAR | Status: DC | PRN
  Administered 2021-06-08: 30 mL via PERINEURAL

## 2021-06-08 MED ORDER — ASPIRIN 81 MG PO CHEW
81.0000 mg | CHEWABLE_TABLET | Freq: Two times a day (BID) | ORAL | Status: DC
  Administered 2021-06-08 – 2021-06-09 (×2): 81 mg via ORAL
  Filled 2021-06-08 (×2): qty 1

## 2021-06-08 MED ORDER — ISOPROPYL ALCOHOL 70 % SOLN
Status: DC | PRN
  Administered 2021-06-08: 1 via TOPICAL

## 2021-06-08 MED ORDER — ALUM & MAG HYDROXIDE-SIMETH 200-200-20 MG/5ML PO SUSP: 30.0000 mL | ORAL | Status: DC | PRN

## 2021-06-08 MED ORDER — ACETAMINOPHEN 10 MG/ML IV SOLN: 1000.0000 mg | Freq: Once | INTRAVENOUS | Status: DC | PRN

## 2021-06-08 MED ORDER — HYDROMORPHONE HCL 1 MG/ML IJ SOLN: 0.5000 mg | INTRAMUSCULAR | Status: DC | PRN

## 2021-06-08 MED ORDER — CELECOXIB 200 MG PO CAPS
200.0000 mg | ORAL_CAPSULE | Freq: Two times a day (BID) | ORAL | Status: DC
  Administered 2021-06-08 – 2021-06-09 (×2): 200 mg via ORAL
  Filled 2021-06-08 (×2): qty 1

## 2021-06-08 MED ORDER — STERILE WATER FOR IRRIGATION IR SOLN
Status: DC | PRN
  Administered 2021-06-08: 2000 mL

## 2021-06-08 MED ORDER — ONDANSETRON HCL 4 MG/2ML IJ SOLN
4.0000 mg | Freq: Four times a day (QID) | INTRAMUSCULAR | Status: DC | PRN
  Administered 2021-06-08 – 2021-06-09 (×2): 4 mg via INTRAVENOUS
  Filled 2021-06-08 (×2): qty 2

## 2021-06-08 MED ORDER — FENTANYL CITRATE PF 50 MCG/ML IJ SOSY
50.0000 ug | PREFILLED_SYRINGE | INTRAMUSCULAR | Status: DC
  Administered 2021-06-08: 50 ug via INTRAVENOUS
  Filled 2021-06-08: qty 2

## 2021-06-08 MED ORDER — MIDAZOLAM HCL 2 MG/2ML IJ SOLN
1.0000 mg | INTRAMUSCULAR | Status: DC
  Administered 2021-06-08: 2 mg via INTRAVENOUS
  Filled 2021-06-08: qty 2

## 2021-06-08 MED ORDER — PROPOFOL 1000 MG/100ML IV EMUL
INTRAVENOUS | Status: AC
  Filled 2021-06-08: qty 200

## 2021-06-08 MED ORDER — ONDANSETRON HCL 4 MG/2ML IJ SOLN
INTRAMUSCULAR | Status: DC | PRN
  Administered 2021-06-08: 4 mg via INTRAVENOUS

## 2021-06-08 SURGICAL SUPPLY — 81 items
ADH SKN CLS APL DERMABOND .7 (GAUZE/BANDAGES/DRESSINGS) ×1
APL PRP STRL LF DISP 70% ISPRP (MISCELLANEOUS) ×2
BAG COUNTER SPONGE SURGICOUNT (BAG) IMPLANT
BAG SPEC THK2 15X12 ZIP CLS (MISCELLANEOUS)
BAG SPNG CNTER NS LX DISP (BAG)
BAG ZIPLOCK 12X15 (MISCELLANEOUS) IMPLANT
BATTERY INSTRU NAVIGATION (MISCELLANEOUS) ×6 IMPLANT
BEARING TIBIA INSRT KNEE SZ4 9 (Insert) IMPLANT
BLADE SAW RECIPROCATING 77.5 (BLADE) ×2 IMPLANT
BNDG CMPR MED 10X6 ELC LF (GAUZE/BANDAGES/DRESSINGS) ×1
BNDG ELASTIC 4X5.8 VLCR STR LF (GAUZE/BANDAGES/DRESSINGS) ×2 IMPLANT
BNDG ELASTIC 6X10 VLCR STRL LF (GAUZE/BANDAGES/DRESSINGS) ×2 IMPLANT
BNDG ELASTIC 6X5.8 VLCR STR LF (GAUZE/BANDAGES/DRESSINGS) ×2 IMPLANT
BSPLAT TIB 4 KN TRITANIUM (Knees) ×1 IMPLANT
BTRY SRG DRVR LF (MISCELLANEOUS) ×3
CHLORAPREP W/TINT 26 (MISCELLANEOUS) ×4 IMPLANT
COMPONENT TRI CR RETAIN KNEE (Orthopedic Implant) IMPLANT
COVER SURGICAL LIGHT HANDLE (MISCELLANEOUS) ×2 IMPLANT
CUFF TOURN SGL QUICK 34 (TOURNIQUET CUFF) ×2
CUFF TRNQT CYL 34X4.125X (TOURNIQUET CUFF) ×1 IMPLANT
DECANTER SPIKE VIAL GLASS SM (MISCELLANEOUS) ×4 IMPLANT
DERMABOND ADVANCED (GAUZE/BANDAGES/DRESSINGS) ×1
DERMABOND ADVANCED .7 DNX12 (GAUZE/BANDAGES/DRESSINGS) ×2 IMPLANT
DRAPE INCISE IOBAN 66X45 STRL (DRAPES) ×6 IMPLANT
DRAPE SHEET LG 3/4 BI-LAMINATE (DRAPES) ×6 IMPLANT
DRAPE U-SHAPE 47X51 STRL (DRAPES) ×2 IMPLANT
DRSG AQUACEL AG ADV 3.5X10 (GAUZE/BANDAGES/DRESSINGS) ×2 IMPLANT
DRSG AQUACEL AG ADV 3.5X14 (GAUZE/BANDAGES/DRESSINGS) ×2 IMPLANT
DRSG TEGADERM 4X4.75 (GAUZE/BANDAGES/DRESSINGS) IMPLANT
ELECT BLADE TIP CTD 4 INCH (ELECTRODE) ×2 IMPLANT
ELECT REM PT RETURN 15FT ADLT (MISCELLANEOUS) ×2 IMPLANT
EVACUATOR 1/8 PVC DRAIN (DRAIN) IMPLANT
GAUZE SPONGE 4X4 12PLY STRL (GAUZE/BANDAGES/DRESSINGS) ×2 IMPLANT
GLOVE SRG 8 PF TXTR STRL LF DI (GLOVE) ×2 IMPLANT
GLOVE SURG ENC MOIS LTX SZ8.5 (GLOVE) ×4 IMPLANT
GLOVE SURG ENC TEXT LTX SZ7.5 (GLOVE) ×6 IMPLANT
GLOVE SURG UNDER POLY LF SZ8 (GLOVE) ×4
GLOVE SURG UNDER POLY LF SZ8.5 (GLOVE) ×2 IMPLANT
GOWN SPEC L3 XXLG W/TWL (GOWN DISPOSABLE) ×2 IMPLANT
GOWN SPEC L4 XLG W/TWL (GOWN DISPOSABLE) ×2 IMPLANT
HANDPIECE INTERPULSE COAX TIP (DISPOSABLE) ×2
HOLDER FOLEY CATH W/STRAP (MISCELLANEOUS) ×2 IMPLANT
HOOD PEEL AWAY FLYTE STAYCOOL (MISCELLANEOUS) ×6 IMPLANT
JET LAVAGE IRRISEPT WOUND (IRRIGATION / IRRIGATOR)
KIT TURNOVER KIT A (KITS) IMPLANT
KNEE PATELLA ASYMMETRIC 10X32 (Knees) ×2 IMPLANT
KNEE TIBIAL COMP TRI SZ4 (Knees) ×1 IMPLANT
LAVAGE JET IRRISEPT WOUND (IRRIGATION / IRRIGATOR) IMPLANT
MARKER SKIN DUAL TIP RULER LAB (MISCELLANEOUS) ×2 IMPLANT
NDL SAFETY ECLIPSE 18X1.5 (NEEDLE) ×1 IMPLANT
NDL SPNL 18GX3.5 QUINCKE PK (NEEDLE) ×1 IMPLANT
NEEDLE HYPO 18GX1.5 SHARP (NEEDLE) ×2
NEEDLE SPNL 18GX3.5 QUINCKE PK (NEEDLE) ×2 IMPLANT
NS IRRIG 1000ML POUR BTL (IV SOLUTION) ×2 IMPLANT
PACK TOTAL KNEE CUSTOM (KITS) ×2 IMPLANT
PADDING CAST COTTON 6X4 STRL (CAST SUPPLIES) ×2 IMPLANT
PIN FLUTED HEDLESS FIX 3.5X1/8 (PIN) ×2 IMPLANT
PROTECTOR NERVE ULNAR (MISCELLANEOUS) ×2 IMPLANT
SAW OSC TIP CART 19.5X105X1.3 (SAW) ×2 IMPLANT
SEALER BIPOLAR AQUA 6.0 (INSTRUMENTS) ×2 IMPLANT
SET HNDPC FAN SPRY TIP SCT (DISPOSABLE) ×1 IMPLANT
SET PAD KNEE POSITIONER (MISCELLANEOUS) ×2 IMPLANT
SPONGE DRAIN TRACH 4X4 STRL 2S (GAUZE/BANDAGES/DRESSINGS) IMPLANT
SUT MNCRL AB 3-0 PS2 18 (SUTURE) ×2 IMPLANT
SUT MNCRL AB 4-0 PS2 18 (SUTURE) ×2 IMPLANT
SUT MON AB 2-0 CT1 36 (SUTURE) ×2 IMPLANT
SUT STRATAFIX PDO 1 14 VIOLET (SUTURE) ×2
SUT STRATFX PDO 1 14 VIOLET (SUTURE) ×1
SUT VIC AB 1 CTX 36 (SUTURE) ×4
SUT VIC AB 1 CTX36XBRD ANBCTR (SUTURE) ×2 IMPLANT
SUT VIC AB 2-0 CT1 27 (SUTURE) ×2
SUT VIC AB 2-0 CT1 TAPERPNT 27 (SUTURE) ×1 IMPLANT
SUTURE STRATFX PDO 1 14 VIOLET (SUTURE) ×1 IMPLANT
SYR 3ML LL SCALE MARK (SYRINGE) ×2 IMPLANT
TIBIA BEAR INSERT KNEE SZ4 9 (Insert) ×2 IMPLANT
TOWER CARTRIDGE SMART MIX (DISPOSABLE) IMPLANT
TRAY FOLEY MTR SLVR 16FR STAT (SET/KITS/TRAYS/PACK) IMPLANT
TRIA CRUCIATE RETAIN KNEE (Orthopedic Implant) ×2 IMPLANT
TUBE SUCTION HIGH CAP CLEAR NV (SUCTIONS) ×2 IMPLANT
WATER STERILE IRR 1000ML POUR (IV SOLUTION) ×4 IMPLANT
WRAP KNEE MAXI GEL POST OP (GAUZE/BANDAGES/DRESSINGS) ×1 IMPLANT

## 2021-06-08 NOTE — H&P (Signed)
TOTAL KNEE ADMISSION H&P  Patient is being admitted for right total knee arthroplasty.  Subjective:  Chief Complaint:right knee pain.  HPI: ASEES MANFREDI, 45 y.o. female, has a history of pain and functional disability in the right knee due to arthritis and has failed non-surgical conservative treatments for greater than 12 weeks to includeNSAID's and/or analgesics, corticosteriod injections, viscosupplementation injections, flexibility and strengthening excercises, supervised PT with diminished ADL's post treatment, use of assistive devices, weight reduction as appropriate, and activity modification.  Onset of symptoms was gradual, starting >10 years ago with rapidlly worsening course since that time. The patient noted no past surgery on the right knee(s).  Patient currently rates pain in the right knee(s) at 10 out of 10 with activity. Patient has night pain, worsening of pain with activity and weight bearing, pain that interferes with activities of daily living, pain with passive range of motion, crepitus, and joint swelling.  This patient has had  RA . There is no active infection.  Patient Active Problem List   Diagnosis Date Noted   Degenerative arthritis of left knee 03/09/2021   Past Medical History:  Diagnosis Date   Arthritis    GERD (gastroesophageal reflux disease)     Past Surgical History:  Procedure Laterality Date   KNEE ARTHROPLASTY Left 03/09/2021   Procedure: COMPUTER ASSISTED TOTAL KNEE ARTHROPLASTY;  Surgeon: Samson Frederic, MD;  Location: [facility];  Service: Orthopedics;  Laterality: Left;     Current Facility-Administered Medications  Medication Dose Route Frequency Provider Last Rate Last Admin   0.9 %  sodium chloride infusion   Intravenous Continuous McCauley, Larry B, PA       0.9 % irrigation (POUR BTL)    PRN Samson Frederic, MD   1,000 mL at 06/08/21 1414   acetaminophen (OFIRMEV) IV 1,000 mg  1,000 mg Intravenous Once McCauley, Larry B, PA        bupivacaine-EPINEPHrine (MARCAINE W/ EPI) 0.25% -1:200000 (with pres) injection    PRN Samson Frederic, MD   30 mL at 06/08/21 1413   fentaNYL (SUBLIMAZE) injection 50-100 mcg  50-100 mcg Intravenous UD Stoltzfus, Gregory P, DO   50 mcg at 06/08/21 1219   isopropyl alcohol 70 % external solution    PRN Samson Frederic, MD   1 application at 06/08/21 1413   ketorolac (TORADOL) 30 MG/ML injection    PRN Samson Frederic, MD   30 mg at 06/08/21 1413   lactated ringers infusion   Intravenous Continuous Jairo Ben, MD 10 mL/hr at 06/08/21 1328 Restarted at 06/08/21 1329   midazolam (VERSED) injection 1-2 mg  1-2 mg Intravenous UD Stoltzfus, Gregory P, DO   2 mg at 06/08/21 1219   povidone-iodine 10 % swab 2 application  2 application Topical Once McCauley, Larry B, PA       sodium chloride (PF) 0.9 % injection    PRN Samson Frederic, MD   30 mL at 06/08/21 1413   sodium chloride irrigation 0.9 %    PRN Samson Frederic, MD   1,000 mL at 06/08/21 1413   sterile water for irrigation for irrigation    PRN Esteen Delpriore, Arlys John, MD   2,000 mL at 06/08/21 1413   Facility-Administered Medications Ordered in Other Encounters  Medication Dose Route Frequency Provider Last Rate Last Admin   bupivacaine 0.75% in dextrose 8.25% (intrathecal) (SENSORCAINE) 0.75-8.25 % injection   Intrathecal Anesthesia Intra-op Sudie Grumbling, CRNA   1.6 mL at 06/08/21 1337   cloNIDine (DURACLON) 100 mcg/mL  inj- OR Only   Infiltration Anesthesia Intra-op Stoltzfus, Gregory P, DO   100 mcg at 06/08/21 1226   dexamethasone (DECADRON) injection   Intravenous Anesthesia Intra-op Sudie Grumbling, CRNA   10 mg at 06/08/21 1400   fentaNYL (SUBLIMAZE) injection   Intravenous Anesthesia Intra-op Sudie Grumbling, CRNA   50 mcg at 06/08/21 1453   glycopyrrolate (ROBINUL) injection   Intravenous Anesthesia Intra-op Sudie Grumbling, CRNA   0.2 mg at 06/08/21 1329   IRRISEPT - 0.05% chlorhexedine in sterile water for irrigation    PRN Samson Frederic, MD   450 mL at 03/09/21 1606   ondansetron (ZOFRAN) injection   Intravenous Anesthesia Intra-op Sudie Grumbling, CRNA   4 mg at 06/08/21 1329   propofol (DIPRIVAN) 10 mg/mL bolus/IV push   Intravenous Anesthesia Intra-op Sudie Grumbling, CRNA   100 mg at 06/08/21 1348   propofol (DIPRIVAN) 500 MG/50ML infusion   Intravenous Continuous PRN Sudie Grumbling, CRNA 41.85 mL/hr at 06/08/21 1506 75 mcg/kg/min at 06/08/21 1506   ropivacaine (PF) 5 mg/mL (0.5%) (NAROPIN) injection   Peri-NEURAL Anesthesia Intra-op Stoltzfus, Gregory P, DO   30 mL at 06/08/21 1226   Allergies  Allergen Reactions   Penicillins Anaphylaxis and Swelling    Social History   Tobacco Use   Smoking status: Never   Smokeless tobacco: Never  Substance Use Topics   Alcohol use: No    History reviewed. No pertinent family history.   Review of Systems  Musculoskeletal:  Positive for arthralgias and joint swelling.  All other systems reviewed and are negative.  Objective:  Physical Exam Constitutional:      General: She is not in acute distress.    Appearance: Normal appearance. She is obese.  HENT:     Head: Normocephalic and atraumatic.     Nose: Nose normal.     Mouth/Throat:     Mouth: Mucous membranes are moist.     Pharynx: Oropharynx is clear.  Eyes:     Extraocular Movements: Extraocular movements intact.     Conjunctiva/sclera: Conjunctivae normal.     Pupils: Pupils are equal, round, and reactive to light.  Cardiovascular:     Rate and Rhythm: Normal rate and regular rhythm.     Pulses: Normal pulses.  Pulmonary:     Effort: Pulmonary effort is normal. No respiratory distress.  Abdominal:     General: Abdomen is flat.     Palpations: Abdomen is soft.  Genitourinary:    Comments: deferred Musculoskeletal:     Cervical back: Normal range of motion and neck supple.     Right knee: Swelling, deformity, effusion, bony tenderness and crepitus present. Decreased range of motion.  Skin:     General: Skin is warm and dry.     Capillary Refill: Capillary refill takes less than 2 seconds.  Neurological:     General: No focal deficit present.     Mental Status: She is alert and oriented to person, place, and time.  Psychiatric:        Mood and Affect: Mood normal.        Behavior: Behavior normal.        Thought Content: Thought content normal.        Judgment: Judgment normal.    Vital signs in last 24 hours: Temp:  [98.1 F (36.7 C)] 98.1 F (36.7 C) (10/27 1058) Pulse Rate:  [74-93] 93 (10/27 1223) Resp:  [15-21] 18 (10/27 1223) BP: (110-115)/(58-92) 115/78 (10/27  1223) SpO2:  [99 %-100 %] 100 % (10/27 1223) Weight:  [93 kg] 93 kg (10/27 1122)  Labs:   Estimated body mass index is 35.74 kg/m as calculated from the following:   Height as of this encounter: 5' 3.5" (1.613 m).   Weight as of this encounter: 93 kg.   Imaging Review Plain radiographs demonstrate severe degenerative joint disease of the right knee(s). The overall alignment issignificant varus. The bone quality appears to be adequate for age and reported activity level.      Assessment/Plan:  End stage arthritis, right knee   The patient history, physical examination, clinical judgment of the provider and imaging studies are consistent with end stage degenerative joint disease of the right knee(s) and total knee arthroplasty is deemed medically necessary. The treatment options including medical management, injection therapy arthroscopy and arthroplasty were discussed at length. The risks and benefits of total knee arthroplasty were presented and reviewed. The risks due to aseptic loosening, infection, stiffness, patella tracking problems, thromboembolic complications and other imponderables were discussed. The patient acknowledged the explanation, agreed to proceed with the plan and consent was signed. Patient is being admitted for inpatient treatment for surgery, pain control, PT, OT, prophylactic  antibiotics, VTE prophylaxis, progressive ambulation and ADL's and discharge planning. The patient is planning to be discharged  home with HEP     Patient's anticipated LOS is less than 2 midnights, meeting these requirements: - Younger than 29 - Lives within 1 hour of care - Has a competent adult at home to recover with post-op recover - NO history of  - Chronic pain requiring opiods  - Diabetes  - Coronary Artery Disease  - Heart failure  - Heart attack  - Stroke  - DVT/VTE  - Cardiac arrhythmia  - Respiratory Failure/COPD  - Renal failure  - Anemia  - Advanced Liver disease

## 2021-06-08 NOTE — Discharge Instructions (Signed)
 Dr. Jaylean Buenaventura [facility] [address] , [place] [address] [phone]  TOTAL KNEE REPLACEMENT POSTOPERATIVE DIRECTIONS    Knee Rehabilitation, Guidelines Following Surgery  Results after knee surgery are often greatly improved when you follow the exercise, range of motion and muscle strengthening exercises prescribed by your doctor. Safety measures are also important to protect the knee from further injury. Any time any of these exercises cause you to have increased pain or swelling in your knee joint, decrease the amount until you are comfortable again and slowly increase them. If you have problems or questions, call your caregiver or physical therapist for advice.   WEIGHT BEARING Weight bearing as tolerated with assist device (walker, cane, etc) as directed, use it as long as suggested by your surgeon or therapist, typically at least 4-6 weeks.  HOME CARE INSTRUCTIONS  Remove items at home which could result in a fall. This includes throw rugs or furniture in walking pathways.  Continue medications as instructed at time of discharge. You may have some home medications which will be placed on hold until you complete the course of blood thinner medication.  You may start showering once you are discharged home but do not submerge the incision under water. Just pat the incision dry and apply a dry gauze dressing on daily. Walk with walker as instructed.  You may resume a sexual relationship in one month or when given the OK by your doctor.  Use walker as long as suggested by your caregivers. Avoid periods of inactivity such as sitting longer than an hour when not asleep. This helps prevent blood clots.  You may put full weight on your legs and walk as much as is comfortable.  You may return to work once you are cleared by your doctor.  Do not drive a car for 6 weeks or until released by you surgeon.  Do not drive while  taking narcotics.  Wear the elastic stockings for three weeks following surgery during the day but you may remove then at night. Make sure you keep all of your appointments after your operation with all of your doctors and caregivers. You should call the office at the above phone number and make an appointment for approximately two weeks after the date of your surgery. Do not remove your surgical dressing. The dressing is waterproof; you may take showers in 3 days, but do not take tub baths or submerge the dressing. Please pick up a stool softener and laxative for home use as long as you are requiring pain medications. ICE to the affected knee every three hours for 30 minutes at a time and then as needed for pain and swelling.  Continue to use ice on the knee for pain and swelling from surgery. You may notice swelling that will progress down to the foot and ankle.  This is normal after surgery.  Elevate the leg when you are not up walking on it.   It is important for you to complete the blood thinner medication as prescribed by your doctor. Continue to use the breathing machine which will help keep your temperature down.  It is common for your temperature to cycle up and down following surgery, especially at night when you are not up moving around and exerting yourself.  The breathing machine keeps your lungs expanded and your temperature down.  RANGE OF MOTION AND STRENGTHENING EXERCISES  Rehabilitation of the knee is important following a knee injury or an   operation. After just a few days of immobilization, the muscles of the thigh which control the knee become weakened and shrink (atrophy). Knee exercises are designed to build up the tone and strength of the thigh muscles and to improve knee motion. Often times heat used for twenty to thirty minutes before working out will loosen up your tissues and help with improving the range of motion but do not use heat for the first two weeks following surgery.  These exercises can be done on a training (exercise) mat, on the floor, on a table or on a bed. Use what ever works the best and is most comfortable for you Knee exercises include:  Leg Lifts - While your knee is still immobilized in a splint or cast, you can do straight leg raises. Lift the leg to 60 degrees, hold for 3 sec, and slowly lower the leg. Repeat 10-20 times 2-3 times daily. Perform this exercise against resistance later as your knee gets better.  Quad and Hamstring Sets - Tighten up the muscle on the front of the thigh (Quad) and hold for 5-10 sec. Repeat this 10-20 times hourly. Hamstring sets are done by pushing the foot backward against an object and holding for 5-10 sec. Repeat as with quad sets.  A rehabilitation program following serious knee injuries can speed recovery and prevent re-injury in the future due to weakened muscles. Contact your doctor or a physical therapist for more information on knee rehabilitation.   POST-OPERATIVE OPIOID TAPER INSTRUCTIONS: It is important to wean off of your opioid medication as soon as possible. If you do not need pain medication after your surgery it is ok to stop day one. Opioids include: Codeine, Hydrocodone(Norco, Vicodin), Oxycodone(Percocet, oxycontin) and hydromorphone amongst others.  Long term and even short term use of opiods can cause: Increased pain response Dependence Constipation Depression Respiratory depression And more.  Withdrawal symptoms can include Flu like symptoms Nausea, vomiting And more Techniques to manage these symptoms Hydrate well Eat regular healthy meals Stay active Use relaxation techniques(deep breathing, meditating, yoga) Do Not substitute Alcohol to help with tapering If you have been on opioids for less than two weeks and do not have pain than it is ok to stop all together.  Plan to wean off of opioids This plan should start within one week post op of your joint replacement. Maintain the same  interval or time between taking each dose and first decrease the dose.  Cut the total daily intake of opioids by one tablet each day Next start to increase the time between doses. The last dose that should be eliminated is the evening dose.    SKILLED REHAB INSTRUCTIONS: If the patient is transferred to a skilled rehab facility following release from the hospital, a list of the current medications will be sent to the facility for the patient to continue.  When discharged from the skilled rehab facility, please have the facility set up the patient's Home Health Physical Therapy prior to being released. Also, the skilled facility will be responsible for providing the patient with their medications at time of release from the facility to include their pain medication, the muscle relaxants, and their blood thinner medication. If the patient is still at the rehab facility at time of the two week follow up appointment, the skilled rehab facility will also need to assist the patient in arranging follow up appointment in our office and any transportation needs.  MAKE SURE YOU:  Understand these instructions.  Will watch   your condition.  Will get help right away if you are not doing well or get worse.    Pick up stool softner and laxative for home use following surgery while on pain medications. Do NOT remove your dressing. You may shower.  Do not take tub baths or submerge incision under water. May shower starting three days after surgery. Please use a clean towel to pat the incision dry following showers. Continue to use ice for pain and swelling after surgery. Do not use any lotions or creams on the incision until instructed by your surgeon.  

## 2021-06-08 NOTE — Anesthesia Postprocedure Evaluation (Signed)
Anesthesia Post Note  Patient: LOVELY KERINS  Procedure(s) Performed: COMPUTER ASSISTED TOTAL KNEE ARTHROPLASTY (Right: Knee)     Patient location during evaluation: PACU Anesthesia Type: General Level of consciousness: awake and alert Pain management: pain level controlled Vital Signs Assessment: post-procedure vital signs reviewed and stable Respiratory status: spontaneous breathing, nonlabored ventilation, respiratory function stable and patient connected to nasal cannula oxygen Cardiovascular status: blood pressure returned to baseline and stable Postop Assessment: no apparent nausea or vomiting Anesthetic complications: no Comments: Converted to GA post spinal placement given inadequate block   No notable events documented.  Last Vitals:  Vitals:   06/08/21 1715 06/08/21 1737  BP: 118/81 113/83  Pulse: (!) 52 (!) 54  Resp: (!) 5 20  Temp: 36.7 C 36.5 C  SpO2: 100% 100%    Last Pain:  Vitals:   06/08/21 1737  TempSrc: Oral  PainSc:                  Nelle Don Shiva Sahagian

## 2021-06-08 NOTE — Transfer of Care (Signed)
Immediate Anesthesia Transfer of Care Note  Patient: Judith Kennedy  Procedure(s) Performed: COMPUTER ASSISTED TOTAL KNEE ARTHROPLASTY (Right: Knee)  Patient Location: PACU  Anesthesia Type:General and Regional  Level of Consciousness: awake, alert  and oriented  Airway & Oxygen Therapy: Patient Spontanous Breathing and Patient connected to face mask  Post-op Assessment: Report given to RN and Post -op Vital signs reviewed and stable  Post vital signs: Reviewed and stable  Last Vitals:  Vitals Value Taken Time  BP    Temp    Pulse    Resp    SpO2      Last Pain:  Vitals:   06/08/21 1122  TempSrc:   PainSc: 3       Patients Stated Pain Goal: 2 (06/08/21 1122)  Complications: No notable events documented.

## 2021-06-08 NOTE — Progress Notes (Signed)
AssistedDr. Greg Stoltzfus with right, ultrasound guided, adductor canal block. Side rails up, monitors on throughout procedure. See vital signs in flow sheet. Tolerated Procedure well.  

## 2021-06-08 NOTE — Anesthesia Procedure Notes (Signed)
Spinal  Patient location during procedure: OR Start time: 06/08/2021 1:37 PM Reason for block: surgical anesthesia Staffing Performed: resident/CRNA  Resident/CRNA: Sudie Grumbling, CRNA Preanesthetic Checklist Completed: patient identified, IV checked, site marked, risks and benefits discussed, surgical consent, monitors and equipment checked, pre-op evaluation and timeout performed Spinal Block Patient position: sitting Prep: DuraPrep Patient monitoring: heart rate, cardiac monitor, continuous pulse ox and blood pressure Approach: midline Location: L3-4 Injection technique: single-shot Needle Needle type: Pencan  Needle gauge: 24 G Needle length: 10 cm Assessment Sensory level: T4 Events: CSF return

## 2021-06-08 NOTE — Plan of Care (Signed)

## 2021-06-08 NOTE — Progress Notes (Signed)
Pt is requesting to buy an Iceman cooler for her knee. This RN will notify RN on [address] west and also Dr. Linna Caprice.

## 2021-06-08 NOTE — Anesthesia Procedure Notes (Signed)
Procedure Name: LMA Insertion Date/Time: 06/08/2021 1:49 PM Performed by: Sudie Grumbling, CRNA Pre-anesthesia Checklist: Patient identified, Emergency Drugs available, Suction available and Patient being monitored Patient Re-evaluated:Patient Re-evaluated prior to induction Oxygen Delivery Method: Circle system utilized Preoxygenation: Pre-oxygenation with 100% oxygen Induction Type: IV induction LMA: LMA inserted LMA Size: 4.0 Number of attempts: 1 Placement Confirmation: positive ETCO2 and breath sounds checked- equal and bilateral Tube secured with: Tape Dental Injury: Teeth and Oropharynx as per pre-operative assessment

## 2021-06-08 NOTE — Anesthesia Procedure Notes (Signed)
Anesthesia Regional Block: Adductor canal block   Pre-Anesthetic Checklist: , timeout performed,  Correct Patient, Correct Site, Correct Laterality,  Correct Procedure, Correct Position, site marked,  Risks and benefits discussed,  Surgical consent,  Pre-op evaluation,  At surgeon's request and post-op pain management  Laterality: Right  Prep: Dura Prep       Needles:  Injection technique: Single-shot  Needle Type: Echogenic Stimulator Needle     Needle Length: 10cm  Needle Gauge: 20     Additional Needles:   Procedures:,,,, ultrasound used (permanent image in chart),,    Narrative:  Start time: 06/08/2021 12:26 PM End time: 06/08/2021 12:30 PM Injection made incrementally with aspirations every 5 mL.  Performed by: Personally  Anesthesiologist: Atilano Median, DO  Additional Notes: Patient identified. Risks/Benefits/Options discussed with patient including but not limited to bleeding, infection, nerve damage, failed block, incomplete pain control. Patient expressed understanding and wished to proceed. All questions were answered. Sterile technique was used throughout the entire procedure. Please see nursing notes for vital signs. Aspirated in 5cc intervals with injection for negative confirmation. Patient was given instructions on fall risk and not to get out of bed. All questions and concerns addressed with instructions to call with any issues or inadequate analgesia.

## 2021-06-08 NOTE — Anesthesia Preprocedure Evaluation (Signed)
Anesthesia Evaluation  Patient identified by MRN, date of birth, ID band  Airway Mallampati: II  TM Distance: >3 FB Neck ROM: Full    Dental  (+) Teeth Intact   Pulmonary neg pulmonary ROS,    Pulmonary exam normal        Cardiovascular negative cardio ROS   Rhythm:Regular Rate:Normal     Neuro/Psych negative neurological ROS  negative psych ROS   GI/Hepatic Neg liver ROS, GERD  ,  Endo/Other  negative endocrine ROS  Renal/GU negative Renal ROS  negative genitourinary   Musculoskeletal  (+) Arthritis , Osteoarthritis,    Abdominal (+)  Abdomen: soft.    Peds  Hematology negative hematology ROS (+)   Anesthesia Other Findings   Reproductive/Obstetrics                             Anesthesia Physical Anesthesia Plan  ASA: 2  Anesthesia Plan: MAC, Regional and Spinal   Post-op Pain Management:  Regional for Post-op pain   Induction: Intravenous  PONV Risk Score and Plan: 2 and Ondansetron, Dexamethasone, Midazolam and Treatment may vary due to age or medical condition  Airway Management Planned: Simple Face Mask, Natural Airway and Nasal Cannula  Additional Equipment: None  Intra-op Plan:   Post-operative Plan:   Informed Consent: I have reviewed the patients History and Physical, chart, labs and discussed the procedure including the risks, benefits and alternatives for the proposed anesthesia with the patient or authorized representative who has indicated his/her understanding and acceptance.     Dental advisory given  Plan Discussed with: CRNA  Anesthesia Plan Comments: (Lab Results      Component                Value               Date                      WBC                      14.2 (H)            05/26/2021                HGB                      12.0                05/26/2021                HCT                      38.9                05/26/2021                MCV                       78.7 (L)            05/26/2021                PLT                      580 (H)             05/26/2021  Lab Results      Component                Value               Date                      NA                       137                 05/26/2021                K                        4.1                 05/26/2021                CO2                      28                  05/26/2021                GLUCOSE                  122 (H)             05/26/2021                BUN                      13                  05/26/2021                CREATININE               0.91                05/26/2021                CALCIUM                  8.7 (L)             05/26/2021                GFRNONAA                 >60                 05/26/2021          )        Anesthesia Quick Evaluation

## 2021-06-08 NOTE — Op Note (Signed)
OPERATIVE REPORT  SURGEON: Judith Can, MD   ASSISTANT: Judith June, PA-C  PREOPERATIVE DIAGNOSIS: Right knee arthritis.   POSTOPERATIVE DIAGNOSIS: Right knee arthritis.   PROCEDURE: Right total knee arthroplasty.   IMPLANTS: Stryker Triathlon CR femur, size 3. Stryker Tritanium tibia, size 4. X3 polyethelyene insert, size 9 mm, CR. 3 button asymmetric patella, size 32 mm.  ANESTHESIA:  MAC, Regional, and Spinal  TOURNIQUET TIME: Not utilized.   ESTIMATED BLOOD LOSS:-200 mL    ANTIBIOTICS: 900 mg clindamycin. 1 g vancomycin.  DRAINS: None.  COMPLICATIONS: None   CONDITION: PACU - hemodynamically stable.   BRIEF CLINICAL NOTE: Judith Kennedy is a 45 y.o. female with a long-standing history of Right knee arthritis. After failing conservative management, the patient was indicated for total knee arthroplasty. The risks, benefits, and alternatives to the procedure were explained, and the patient elected to proceed.  PROCEDURE IN DETAIL: Adductor canal block was obtained in the pre-op holding area. Once inside the operative room, spinal anesthesia was obtained, and a foley catheter was inserted. The patient was then positioned, a nonsterile tourniquet was placed, and the lower extremity was prepped and draped in the normal sterile surgical fashion.  A time-out was called verifying side and site of surgery. The patient received IV antibiotics within 60 minutes of beginning the procedure. The tourniquet was not utilized.   An anterior approach to the knee was performed utilizing a midvastus arthrotomy. A medial release was performed and the patellar fat pad was excised. Stryker imageless navigation was used to cut the distal femur perpendicular to the mechanical axis. A freehand patellar resection was performed, and the patella was sized and prepared with 3 lug holes.  Nagivation  was used to make a neutral proximal tibia resection, taking 9 mm of bone from the less affected lateral side with 3 degrees of slope. The menisci were excised. A spacer block was placed, and the alignment and balance in extension were confirmed.   The distal femur was sized using the 3-degree external rotation guide referencing the posterior femoral cortex. The appropriate 4-in-1 cutting block was pinned into place. Rotation was checked using Whiteside's line, the epicondylar axis, and then confirmed with a spacer block in flexion. The remaining femoral cuts were performed, taking care to protect the MCL.  The tibia was sized and the trial tray was pinned into place. The remaining trail components were inserted. The knee was stable to varus and valgus stress through a full range of motion. The patella tracked centrally, and the PCL was well balanced. The trial components were removed, and the proximal tibial surface was prepared. Final components were impacted into place. The knee was tested for a final time and found to be well balanced.   The wound was copiously irrigated with Irrisept solution and normal saline using pule lavage.  Marcaine solution was injected into the periarticular soft tissue.  The wound was closed in layers using #1 Vicryl and Stratafix for the fascia, 2-0 Vicryl for the subcutaneous fat, 2-0 Monocryl for the deep dermal layer, 3-0 running Monocryl subcuticular Stitch, and 4-0 Monocryl stay sutures at both ends of the wound. Dermabond was applied to the skin.  Once the glue was fully dried, an Aquacell Ag and compressive dressing were applied.  Tthe patient was transported to the recovery room in stable condition.  Sponge, needle, and instrument counts were correct at the end of the case x2.  The patient tolerated the procedure well and there were no known complications.  Please note that a surgical assistant was a medical necessity for this procedure in order to perform it in a safe  and expeditious manner. Surgical assistant was necessary to retract the ligaments and vital neurovascular structures to prevent injury to them and also necessary for proper positioning of the limb to allow for anatomic placement of the prosthesis.

## 2021-06-09 ENCOUNTER — Encounter (HOSPITAL_COMMUNITY): Payer: Self-pay | Admitting: Orthopedic Surgery

## 2021-06-09 DIAGNOSIS — M1711 Unilateral primary osteoarthritis, right knee: Secondary | ICD-10-CM | POA: Diagnosis not present

## 2021-06-09 LAB — CBC
HCT: 30.7 % — ABNORMAL LOW (ref 36.0–46.0)
Hemoglobin: 9.7 g/dL — ABNORMAL LOW (ref 12.0–15.0)
MCH: 24.9 pg — ABNORMAL LOW (ref 26.0–34.0)
MCHC: 31.6 g/dL (ref 30.0–36.0)
MCV: 78.7 fL — ABNORMAL LOW (ref 80.0–100.0)
Platelets: 362 10*3/uL (ref 150–400)
RBC: 3.9 MIL/uL (ref 3.87–5.11)
RDW: 18 % — ABNORMAL HIGH (ref 11.5–15.5)
WBC: 10.1 10*3/uL (ref 4.0–10.5)
nRBC: 0 % (ref 0.0–0.2)

## 2021-06-09 LAB — BASIC METABOLIC PANEL
Anion gap: 7 (ref 5–15)
BUN: 9 mg/dL (ref 6–20)
CO2: 23 mmol/L (ref 22–32)
Calcium: 8.2 mg/dL — ABNORMAL LOW (ref 8.9–10.3)
Chloride: 106 mmol/L (ref 98–111)
Creatinine, Ser: 0.66 mg/dL (ref 0.44–1.00)
GFR, Estimated: >60 mL/min (ref >60)
Glucose, Bld: 157 mg/dL — ABNORMAL HIGH (ref 70–99)
Potassium: 4.2 mmol/L (ref 3.5–5.1)
Sodium: 136 mmol/L (ref 135–145)

## 2021-06-09 MED ORDER — OXYCODONE HCL 5 MG PO TABS: 5.0000 mg | ORAL_TABLET | ORAL | 0 refills | Status: DC | PRN

## 2021-06-09 MED ORDER — ASPIRIN 81 MG PO CHEW: 81.0000 mg | CHEWABLE_TABLET | Freq: Two times a day (BID) | ORAL | Status: AC

## 2021-06-09 MED ORDER — OXYCODONE HCL ER 10 MG PO T12A: EXTENDED_RELEASE_TABLET | ORAL | 0 refills | Status: AC

## 2021-06-09 MED ORDER — ONDANSETRON HCL 4 MG PO TABS: 4.0000 mg | ORAL_TABLET | Freq: Four times a day (QID) | ORAL | 0 refills | Status: AC | PRN

## 2021-06-09 MED ORDER — OXYCODONE HCL ER 10 MG PO T12A: 10.0000 mg | EXTENDED_RELEASE_TABLET | Freq: Two times a day (BID) | ORAL | 0 refills | Status: DC

## 2021-06-09 MED ORDER — SENNA 8.6 MG PO TABS: 1.0000 | ORAL_TABLET | Freq: Two times a day (BID) | ORAL | 0 refills | Status: AC

## 2021-06-09 MED ORDER — ACETAMINOPHEN 325 MG PO TABS: 325.0000 mg | ORAL_TABLET | Freq: Four times a day (QID) | ORAL | Status: DC | PRN

## 2021-06-09 NOTE — Progress Notes (Addendum)
Physical Therapy Treatment Patient Details Name: Judith Kennedy MRN: [id number] DOB: Oct 23, 1975 Today's Date: 06/09/2021   History of Present Illness Pt s/p R TKR and with hx of recent L TKR and pt reports very recent dx of RA currently affecting only L wrist and hand    PT Comments    Pt motivated and progressing well with mobility but requiring increased time for all tasks.  Noted improvement in ambulatory stability and quality of all movements.  Pt up to ambulate increased distance in hall and performed expanded HEP with written instruction provided and reviewed.  Pt feeling much more confident and eager for dc home this date.   Recommendations for follow up therapy are one component of a multi-disciplinary discharge planning process, led by the attending physician.  Recommendations may be updated based on patient status, additional functional criteria and insurance authorization.  Follow Up Recommendations  Follow physician's recommendations for discharge plan and follow up therapies     Assistance Recommended at Discharge Intermittent Supervision/Assistance  Equipment Recommendations  None recommended by PT    Recommendations for Other Services       Precautions / Restrictions Precautions Precautions: Knee;Fall Precaution Comments: Loss of balance this am requiring assist to prevent fall Restrictions Weight Bearing Restrictions: No Other Position/Activity Restrictions: WBAT     Mobility  Bed Mobility Overal bed mobility: Needs Assistance Bed Mobility: Sit to Supine     Supine to sit: Min assist Sit to supine: Min guard   General bed mobility comments: cues for sequence and use of R LE to self assist    Transfers Overall transfer level: Needs assistance Equipment used: Rolling walker (2 wheels) Transfers: Sit to/from Stand Sit to Stand: Min guard;Supervision           General transfer comment: cues for LE management and use of UEs to self assist.     Ambulation/Gait Ambulation/Gait assistance: Min guard;Supervision Gait Distance (Feet): 100 Feet Assistive device: Rolling walker (2 wheels) Gait Pattern/deviations: Step-to pattern;Decreased step length - right;Decreased step length - left;Shuffle;Trunk flexed Gait velocity: decr   General Gait Details: Increased time with min cues for sequence, posture and position from [employer]             Wheelchair Mobility    Modified Rankin (Stroke Patients Only)       Balance Overall balance assessment: Needs assistance Sitting-balance support: Feet supported;No upper extremity supported Sitting balance-Leahy Scale: Good     Standing balance support: No upper extremity supported Standing balance-Leahy Scale: Fair Standing balance comment: pt attempting to fall to R with removing L hand from RW to adjust gown                            Cognition Arousal/Alertness: Awake/alert Behavior During Therapy: WFL for tasks assessed/performed Overall Cognitive Status: Within Functional Limits for tasks assessed                                          Exercises Total Joint Exercises Ankle Circles/Pumps: AROM;Both;15 reps;Supine Quad Sets: AROM;10 reps;Supine;Both Heel Slides: AAROM;Right;15 reps;Supine Hip ABduction/ADduction: AAROM;AROM;Right;10 reps;Supine Straight Leg Raises: AAROM;AROM;Right;15 reps;Supine Long Arc Quad: AROM;Right;10 reps;Seated Knee Flexion: AROM;Right;10 reps;Seated    General Comments        Pertinent Vitals/Pain Pain Assessment: 0-10 Pain Score: 5  Pain Location: R knee Pain  Descriptors / Indicators: Aching;Sore Pain Intervention(s): Monitored during session;Limited activity within patient's tolerance;Premedicated before session;Ice applied    Home Living Family/patient expects to be discharged to:: Private residence Living Arrangements: Parent Available Help at Discharge: Family;Available 24 hours/day Type  of Home: House Home Access: Ramped entrance       Home Layout: One level Home Equipment: Agricultural consultant (2 wheels);Cane - single point;BSC      Prior Function            PT Goals (current goals can now be found in the care plan section) Acute Rehab PT Goals Patient Stated Goal: Regain IND PT Goal Formulation: With patient Time For Goal Achievement: 06/09/21 Potential to Achieve Goals: Good Progress towards PT goals: Progressing toward goals    Frequency    7X/week      PT Plan Current plan remains appropriate    Co-evaluation              AM-PAC PT "6 Clicks" Mobility   Outcome Measure  Help needed turning from your back to your side while in a flat bed without using bedrails?: A Little Help needed moving from lying on your back to sitting on the side of a flat bed without using bedrails?: A Little Help needed moving to and from a bed to a chair (including a wheelchair)?: A Little Help needed standing up from a chair using your arms (e.g., wheelchair or bedside chair)?: A Little Help needed to walk in hospital room?: A Little Help needed climbing 3-5 steps with a railing? : A Little 6 Click Score: 18    End of Session Equipment Utilized During Treatment: Gait belt Activity Tolerance: Patient tolerated treatment well Patient left: in bed;with call bell/phone within reach Nurse Communication: Mobility status PT Visit Diagnosis: Unsteadiness on feet (R26.81);Difficulty in walking, not elsewhere classified (R26.2)     Time: 2376-2831 PT Time Calculation (min) (ACUTE ONLY): 55 min  Charges:  $Gait Training: 23-37 mins $Therapeutic Exercise: 23-37 mins                     Mauro Kaufmann PT Acute Rehabilitation Services Pager [phone] Office [phone]    Ornella Coderre 06/09/2021, 2:20 PM

## 2021-06-09 NOTE — TOC Transition Note (Signed)
Transition of Care [facility]) - CM/SW Discharge Note   Patient Details  Name: Judith Kennedy MRN: [id number] Date of Birth: 1976/03/09  [facility] of [facility]) CM/SW Contact:  Lennart Pall, LCSW Phone Number: 06/09/2021, 9:48 AM   Clinical Narrative:    Met with pt and confirming she has all needed DME at home. Plan for OPPT in [place], [place].  No further TOC needs.   Final next level of care: OP Rehab Barriers to Discharge: No Barriers Identified   Patient Goals and CMS Choice Patient states their goals for this hospitalization and ongoing recovery are:: return home      Discharge Placement                       Discharge Plan and Services                DME Arranged: N/A DME Agency: NA                  Social Determinants of Health (SDOH) Interventions     Readmission Risk Interventions No flowsheet data found.

## 2021-06-09 NOTE — Evaluation (Signed)
Physical Therapy Evaluation Patient Details Name: Judith Kennedy MRN: [id number] DOB: 12/04/1975 Today's Date: 06/09/2021  History of Present Illness  Pt s/p R TKR and with hx of recent L TKR and pt reports very recent dx of RA currently affecting only L wrist and hand  Clinical Impression  Pt s/p R TKR and presents with decreased R LE strength/ROM, post op pain and balance deficits limiting functional mobility.  Pt should progress to dc home with family assist and reports first OP PT scheduled for Wednesday, 06/2321     Recommendations for follow up therapy are one component of a multi-disciplinary discharge planning process, led by the attending physician.  Recommendations may be updated based on patient status, additional functional criteria and insurance authorization.  Follow Up Recommendations Follow physician's recommendations for discharge plan and follow up therapies    Assistance Recommended at Discharge Intermittent Supervision/Assistance  Functional Status Assessment Patient has had a recent decline in their functional status and demonstrates the ability to make significant improvements in function in a reasonable and predictable amount of time.  Equipment Recommendations  None recommended by PT    Recommendations for Other Services       Precautions / Restrictions Precautions Precautions: Knee;Fall Precaution Comments: Loss of balance this am requiring assist to prevent fall Restrictions Weight Bearing Restrictions: No Other Position/Activity Restrictions: WBAT      Mobility  Bed Mobility Overal bed mobility: Needs Assistance Bed Mobility: Supine to Sit     Supine to sit: Min assist     General bed mobility comments: cues for sequence and use of R LE to self assist    Transfers Overall transfer level: Needs assistance Equipment used: Rolling walker (2 wheels) Transfers: Sit to/from Stand Sit to Stand: Min assist           General transfer comment:  cues for LE management and use of UEs to self assist.    Ambulation/Gait Ambulation/Gait assistance: Min assist Gait Distance (Feet): 70 Feet Assistive device: Rolling walker (2 wheels) Gait Pattern/deviations: Step-to pattern;Decreased step length - right;Decreased step length - left;Shuffle;Trunk flexed Gait velocity: decr   General Gait Details: Increased time with cues for sequence, posture and position from [employer]            Wheelchair Mobility    Modified Rankin (Stroke Patients Only)       Balance Overall balance assessment: Needs assistance Sitting-balance support: Feet supported;No upper extremity supported Sitting balance-Leahy Scale: Good     Standing balance support: Bilateral upper extremity supported Standing balance-Leahy Scale: Poor Standing balance comment: pt attempting to fall to R with removing L hand from RW to adjust gown                             Pertinent Vitals/Pain Pain Assessment: 0-10 Pain Score: 5  Pain Location: R knee Pain Descriptors / Indicators: Aching;Sore Pain Intervention(s): Limited activity within patient's tolerance;Monitored during session;Premedicated before session;Ice applied    Home Living Family/patient expects to be discharged to:: Private residence Living Arrangements: Parent Available Help at Discharge: Family;Available 24 hours/day Type of Home: House Home Access: Ramped entrance       Home Layout: One level Home Equipment: Agricultural consultant (2 wheels);Cane - single point;BSC      Prior Function Prior Level of Function : Independent/Modified Independent  Hand Dominance        Extremity/Trunk Assessment   Upper Extremity Assessment Upper Extremity Assessment: LUE deficits/detail LUE Deficits / Details: limited L hand strength 2* new dx RA    Lower Extremity Assessment Lower Extremity Assessment: RLE deficits/detail;LLE deficits/detail RLE Deficits /  Details: Knee flex ltd to ~75 LLE Deficits / Details: 3/5 quads with IND SLR and AAROM at knee -5 - 90    Cervical / Trunk Assessment Cervical / Trunk Assessment: Normal  Communication   Communication: No difficulties  Cognition Arousal/Alertness: Awake/alert Behavior During Therapy: WFL for tasks assessed/performed Overall Cognitive Status: Within Functional Limits for tasks assessed                                          General Comments      Exercises Total Joint Exercises Ankle Circles/Pumps: AROM;Both;15 reps;Supine Quad Sets: AROM;10 reps;Supine;Both Heel Slides: AAROM;Right;15 reps;Supine Hip ABduction/ADduction: AAROM;AROM;Right;10 reps;Supine Straight Leg Raises: AAROM;AROM;Right;15 reps;Supine   Assessment/Plan    PT Assessment Patient needs continued PT services  PT Problem List Decreased strength;Decreased range of motion;Decreased activity tolerance;Decreased balance;Decreased mobility;Decreased knowledge of use of DME;Pain       PT Treatment Interventions DME instruction;Gait training;Stair training;Functional mobility training;Therapeutic activities;Therapeutic exercise;Balance training;Patient/family education    PT Goals (Current goals can be found in the Care Plan section)  Acute Rehab PT Goals Patient Stated Goal: Regain IND PT Goal Formulation: With patient Time For Goal Achievement: 06/09/21 Potential to Achieve Goals: Good    Frequency 7X/week   Barriers to discharge        Co-evaluation               AM-PAC PT "6 Clicks" Mobility  Outcome Measure Help needed turning from your back to your side while in a flat bed without using bedrails?: A Little Help needed moving from lying on your back to sitting on the side of a flat bed without using bedrails?: A Little Help needed moving to and from a bed to a chair (including a wheelchair)?: A Little Help needed standing up from a chair using your arms (e.g., wheelchair or  bedside chair)?: A Little Help needed to walk in hospital room?: A Little Help needed climbing 3-5 steps with a railing? : A Little 6 Click Score: 18    End of Session Equipment Utilized During Treatment: Gait belt Activity Tolerance: Patient tolerated treatment well Patient left: in chair;with call bell/phone within reach;with chair alarm set Nurse Communication: Mobility status PT Visit Diagnosis: Unsteadiness on feet (R26.81);Difficulty in walking, not elsewhere classified (R26.2)    Time: 0822-0915 PT Time Calculation (min) (ACUTE ONLY): 53 min   Charges:   PT Evaluation $PT Eval Low Complexity: 1 Low PT Treatments $Gait Training: 8-22 mins $Therapeutic Exercise: 8-22 mins        Mauro Kaufmann PT Acute Rehabilitation Services Pager [phone] Office [phone]   Madiline Saffran 06/09/2021, 2:12 PM

## 2021-06-09 NOTE — Plan of Care (Signed)
Plan of care reviewed and discussed with the patient. 

## 2021-06-09 NOTE — Progress Notes (Signed)
    Subjective:  Patient reports pain as mild to moderate.  Denies N/V/CP/SOB.   Objective:   VITALS:   Vitals:   06/08/21 1737 06/08/21 1946 06/09/21 0130 06/09/21 0524  BP: 113/83 (!) 104/59 (!) 114/57 116/67  Pulse: (!) 54 (!) 58 60 60  Resp: 20 14 15 14   Temp: 97.7 F (36.5 C) 97.6 F (36.4 C) 97.6 F (36.4 C) 97.7 F (36.5 C)  TempSrc: Oral Oral Axillary Oral  SpO2: 100% 100% 100% 100%  Weight: 93 kg     Height: 5\' 3"  (1.6 m)       NAD ABD soft Neurovascular intact Sensation intact distally Intact pulses distally Dorsiflexion/Plantar flexion intact Incision: dressing C/D/I   Lab Results  Component Value Date   WBC 10.1 06/09/2021   HGB 9.7 (L) 06/09/2021   HCT 30.7 (L) 06/09/2021   MCV 78.7 (L) 06/09/2021   PLT 362 06/09/2021   BMET    Component Value Date/Time   NA 136 06/09/2021 0303   K 4.2 06/09/2021 0303   CL 106 06/09/2021 0303   CO2 23 06/09/2021 0303   GLUCOSE 157 (H) 06/09/2021 0303   BUN 9 06/09/2021 0303   CREATININE 0.66 06/09/2021 0303   CALCIUM 8.2 (L) 06/09/2021 0303   GFRNONAA >60 06/09/2021 0303     Assessment/Plan: 1 Day Post-Op   Active Problems:   Osteoarthritis of right knee   WBAT with walker DVT ppx: Aspirin, SCDs, TEDS PO pain control PT/OT ABLA: Asymptomatic Dispo: D/C home once cleared by therapy   06/11/2021 06/09/2021, 7:38 AM [facility] is now 06/11/2021 [address], [place], [employer] [place] Phone: [phone] [email or web address] Facebook  [phone]

## 2021-08-21 NOTE — Patient Instructions (Signed)
DUE TO COVID-19 ONLY ONE VISITOR IS ALLOWED TO COME WITH YOU AND STAY IN THE WAITING ROOM ONLY DURING PRE OP AND PROCEDURE.   **NO VISITORS ARE ALLOWED IN THE SHORT STAY AREA OR RECOVERY ROOM!!**  IF YOU WILL BE ADMITTED INTO THE HOSPITAL YOU ARE ALLOWED ONLY TWO SUPPORT PEOPLE DURING VISITATION HOURS ONLY (7 AM -8PM)   The support person(s) must pass our screening, gel in and out, and wear a mask at all times, including in the patients room. Patients must also wear a mask when staff or their support person are in the room. Visitors GUEST BADGE MUST BE WORN VISIBLY  One adult visitor may remain with you overnight and MUST be in the room by 8 P.M.  No visitors under the age of 74. Any visitor under the age of 67 must be accompanied by an adult.        Your procedure is scheduled on: 08/28/21   Report to [facility] Main Entrance    Report to admitting at : 10:00 AM   Call this number if you have problems the morning of surgery [phone]   Do not eat food :After Midnight.   May have liquids until : 9:15 AM   day of surgery  CLEAR LIQUID DIET  Foods Allowed                                                                     Foods Excluded  Water, Black Coffee and tea, regular and decaf                             liquids that you cannot  Plain Jell-O in any flavor  (No red)                                           see through such as: Fruit ices (not with fruit pulp)                                     milk, soups, orange juice              Iced Popsicles (No red)                                    All solid food                                   Apple juices Sports drinks like Gatorade (No red) Lightly seasoned clear broth or consume(fat free) Sugar Sample Menu Breakfast                                Lunch  Supper Cranberry juice                    Beef broth                            Chicken broth Jell-O                                      Grape juice                           Apple juice Coffee or tea                        Jell-O                                      Popsicle                                                Coffee or tea                        Coffee or tea     Complete one Ensure drink the morning of surgery at : 9:15 AM      the day of surgery.    The day of surgery:  Drink ONE (1) Pre-Surgery Clear Ensure or G2 by am the morning of surgery. Drink in one sitting. Do not sip.  This drink was given to you during your hospital  pre-op appointment visit. Nothing else to drink after completing the  Pre-Surgery Clear Ensure or G2.          If you have questions, please contact your surgeons office.     Oral Hygiene is also important to reduce your risk of infection.                                    Remember - BRUSH YOUR TEETH THE MORNING OF SURGERY WITH YOUR REGULAR TOOTHPASTE   Do NOT smoke after Midnight   Take these medicines the morning of surgery with A SIP OF WATER: prednisone.  DO NOT TAKE ANY ORAL DIABETIC MEDICATIONS DAY OF YOUR SURGERY                              You may not have any metal on your body including hair pins, jewelry, and body piercing             Do not wear make-up, lotions, powders, perfumes/cologne, or deodorant  Do not wear nail polish including gel and S&S, artificial/acrylic nails, or any other type of covering on natural nails including finger and toenails. If you have artificial nails, gel coating, etc. that needs to be removed by a nail salon please have this removed prior to surgery or surgery may need to be canceled/ delayed if the surgeon/ anesthesia feels like they are unable to be safely monitored.   Do not shave  48 hours prior to surgery.    Do not bring valuables to the hospital. [facility] IS NOT             RESPONSIBLE   FOR VALUABLES.   Contacts, dentures or bridgework may not be worn into surgery.   Bring small overnight bag day of  surgery.    Patients discharged on the day of surgery will not be allowed to drive home.  We recommend you have a responsible adult to stay with you for the first 24 hours after anesthesia.   Special Instructions: Bring a copy of your healthcare power of attorney and living will documents         the day of surgery if you haven't scanned them before.              Please read over the following fact sheets you were given: IF YOU HAVE QUESTIONS ABOUT YOUR PRE-OP INSTRUCTIONS PLEASE CALL [phone]     [facility] Before surgery, you can play an important role.  Because skin is not sterile, your skin needs to be as free of germs as possible.  You can reduce the number of germs on your skin by washing with CHG (chlorahexidine gluconate) soap before surgery.  CHG is an antiseptic cleaner which kills germs and bonds with the skin to continue killing germs even after washing. Please DO NOT use if you have an allergy to CHG or antibacterial soaps.  If your skin becomes reddened/irritated stop using the CHG and inform your nurse when you arrive at Short Stay. Do not shave (including legs and underarms) for at least 48 hours prior to the first CHG shower.  You may shave your face/neck. Please follow these instructions carefully:  1.  Shower with CHG Soap the night before surgery and the  morning of Surgery.  2.  If you choose to wash your hair, wash your hair first as usual with your  normal  shampoo.  3.  After you shampoo, rinse your hair and body thoroughly to remove the  shampoo.                           4.  Use CHG as you would any other liquid soap.  You can apply chg directly  to the skin and wash                       Gently with a scrungie or clean washcloth.  5.  Apply the CHG Soap to your body ONLY FROM THE NECK DOWN.   Do not use on face/ open                           Wound or open sores. Avoid contact with eyes, ears mouth and genitals (private parts).                        Wash face,  Genitals (private parts) with your normal soap.             6.  Wash thoroughly, paying special attention to the area where your surgery  will be performed.  7.  Thoroughly rinse your body with warm water from the neck down.  8.  DO NOT shower/wash with your normal soap after using and rinsing off  the CHG Soap.  9.  Pat yourself dry with a clean towel.            10.  Wear clean pajamas.            11.  Place clean sheets on your bed the night of your first shower and do not  sleep with pets. Day of Surgery : Do not apply any lotions/deodorants the morning of surgery.  Please wear clean clothes to the hospital/surgery center.  FAILURE TO FOLLOW THESE INSTRUCTIONS MAY RESULT IN THE CANCELLATION OF YOUR SURGERY PATIENT SIGNATURE_________________________________  NURSE SIGNATURE__________________________________  ________________________________________________________________________   Judith Kennedy  An incentive spirometer is a tool that can help keep your lungs clear and active. This tool measures how well you are filling your lungs with each breath. Taking long deep breaths may help reverse or decrease the chance of developing breathing (pulmonary) problems (especially infection) following: A long period of time when you are unable to move or be active. BEFORE THE PROCEDURE  If the spirometer includes an indicator to show your best effort, your nurse or respiratory therapist will set it to a desired goal. If possible, sit up straight or lean slightly forward. Try not to slouch. Hold the incentive spirometer in an upright position. INSTRUCTIONS FOR USE  Sit on the edge of your bed if possible, or sit up as far as you can in bed or on a chair. Hold the incentive spirometer in an upright position. Breathe out normally. Place the mouthpiece in your mouth and seal your lips tightly around it. Breathe in slowly and as deeply as possible, raising the  piston or the ball toward the top of the column. Hold your breath for 3-5 seconds or for as long as possible. Allow the piston or ball to fall to the bottom of the column. Remove the mouthpiece from your mouth and breathe out normally. Rest for a few seconds and repeat Steps 1 through 7 at least 10 times every 1-2 hours when you are awake. Take your time and take a few normal breaths between deep breaths. The spirometer may include an indicator to show your best effort. Use the indicator as a goal to work toward during each repetition. After each set of 10 deep breaths, practice coughing to be sure your lungs are clear. If you have an incision (the cut made at the time of surgery), support your incision when coughing by placing a pillow or rolled up towels firmly against it. Once you are able to get out of bed, walk around indoors and cough well. You may stop using the incentive spirometer when instructed by your caregiver.  RISKS AND COMPLICATIONS Take your time so you do not get dizzy or light-headed. If you are in pain, you may need to take or ask for pain medication before doing incentive spirometry. It is harder to take a deep breath if you are having pain. AFTER USE Rest and breathe slowly and easily. It can be helpful to keep track of a log of your progress. Your caregiver can provide you with a simple table to help with this. If you are using the spirometer at home, follow these instructions: [place] IF:  You are having difficultly using the spirometer. You have trouble using the spirometer as often as instructed. Your pain medication is not giving enough relief while using the spirometer. You develop fever of 100.5 F (38.1 C) or higher. SEEK IMMEDIATE MEDICAL CARE IF:  You cough up bloody sputum that had not  been present before. You develop fever of 102 F (38.9 C) or greater. You develop worsening pain at or near the incision site. MAKE SURE YOU:  Understand these  instructions. Will watch your condition. Will get help right away if you are not doing well or get worse. Document Released: 12/10/2006 Document Revised: 10/22/2011 Document Reviewed: 02/10/2007 [facility] Patient Information 2014 [place], [place].   ________________________________________________________________________

## 2021-08-22 ENCOUNTER — Other Ambulatory Visit: Payer: Self-pay

## 2021-08-22 ENCOUNTER — Encounter (HOSPITAL_COMMUNITY): Payer: Self-pay

## 2021-08-22 ENCOUNTER — Encounter (HOSPITAL_COMMUNITY)
Admission: RE | Admit: 2021-08-22 | Discharge: 2021-08-22 | Disposition: A | Discharge status: Home / Self Care | Payer: No Typology Code available for payment source | Source: Ambulatory Visit | Attending: Orthopedic Surgery | Admitting: Orthopedic Surgery

## 2021-08-22 VITALS — BP 131/74 | HR 72 | Temp 98.2°F | Ht 63.5 in | Wt 213.0 lb

## 2021-08-22 DIAGNOSIS — Z01818 Encounter for other preprocedural examination: Secondary | ICD-10-CM

## 2021-08-22 DIAGNOSIS — Z01812 Encounter for preprocedural laboratory examination: Secondary | ICD-10-CM | POA: Diagnosis present

## 2021-08-22 LAB — CBC
HCT: 38.6 % (ref 36.0–46.0)
Hemoglobin: 11.9 g/dL — ABNORMAL LOW (ref 12.0–15.0)
MCH: 25.4 pg — ABNORMAL LOW (ref 26.0–34.0)
MCHC: 30.8 g/dL (ref 30.0–36.0)
MCV: 82.5 fL (ref 80.0–100.0)
Platelets: 411 10*3/uL — ABNORMAL HIGH (ref 150–400)
RBC: 4.68 MIL/uL (ref 3.87–5.11)
RDW: 20.5 % — ABNORMAL HIGH (ref 11.5–15.5)
WBC: 12.1 10*3/uL — ABNORMAL HIGH (ref 4.0–10.5)
nRBC: 0 % (ref 0.0–0.2)

## 2021-08-22 NOTE — Progress Notes (Signed)
COVID Vaccine Completed: Yes Date COVID Vaccine completed: 06/23/20 x 3 COVID vaccine manufacturer:   Moderna    COVID Test: N/A PCP - Coralee Rud: DO Cardiologist -   Chest x-ray -  EKG -  Stress Test -  ECHO -  Cardiac Cath -  Pacemaker/ICD device last checked:  Sleep Study -  CPAP -   Fasting Blood Sugar -  Checks Blood Sugar _____ times a day  Blood Thinner Instructions: Aspirin Instructions: Last Dose:  Anesthesia review:   Patient denies shortness of breath, fever, cough and chest pain at PAT appointment   Patient verbalized understanding of instructions that were given to them at the PAT appointment. Patient was also instructed that they will need to review over the PAT instructions again at home before surgery.

## 2021-08-28 ENCOUNTER — Ambulatory Visit (HOSPITAL_COMMUNITY)
Admission: RE | Admit: 2021-08-28 | Discharge: 2021-08-28 | Disposition: A | Discharge status: Home / Self Care | Payer: No Typology Code available for payment source | Attending: Orthopedic Surgery | Admitting: Orthopedic Surgery

## 2021-08-28 ENCOUNTER — Ambulatory Visit (HOSPITAL_COMMUNITY): Payer: No Typology Code available for payment source | Admitting: Anesthesiology

## 2021-08-28 ENCOUNTER — Encounter (HOSPITAL_COMMUNITY): Payer: Self-pay | Admitting: Orthopedic Surgery

## 2021-08-28 ENCOUNTER — Encounter (HOSPITAL_COMMUNITY)
Admission: RE | Disposition: A | Discharge status: Home / Self Care | Payer: Self-pay | Source: Home / Self Care | Attending: Orthopedic Surgery

## 2021-08-28 DIAGNOSIS — M24662 Ankylosis, left knee: Secondary | ICD-10-CM | POA: Insufficient documentation

## 2021-08-28 DIAGNOSIS — Z96652 Presence of left artificial knee joint: Secondary | ICD-10-CM | POA: Diagnosis not present

## 2021-08-28 DIAGNOSIS — Z7952 Long term (current) use of systemic steroids: Secondary | ICD-10-CM | POA: Diagnosis not present

## 2021-08-28 DIAGNOSIS — K219 Gastro-esophageal reflux disease without esophagitis: Secondary | ICD-10-CM | POA: Diagnosis not present

## 2021-08-28 DIAGNOSIS — Z01818 Encounter for other preprocedural examination: Secondary | ICD-10-CM

## 2021-08-28 HISTORY — PX: KNEE ARTHROSCOPY: SHX127

## 2021-08-28 LAB — PREGNANCY, URINE: Preg Test, Ur: NEGATIVE

## 2021-08-28 SURGERY — ARTHROSCOPY, KNEE
Anesthesia: General | Site: Knee | Laterality: Left

## 2021-08-28 MED ORDER — FENTANYL CITRATE (PF) 100 MCG/2ML IJ SOLN
INTRAMUSCULAR | Status: DC | PRN
  Administered 2021-08-28: 25 ug via INTRAVENOUS
  Administered 2021-08-28 (×2): 50 ug via INTRAVENOUS
  Administered 2021-08-28: 25 ug via INTRAVENOUS

## 2021-08-28 MED ORDER — OXYCODONE HCL 5 MG/5ML PO SOLN: 5.0000 mg | Freq: Once | ORAL | Status: AC | PRN

## 2021-08-28 MED ORDER — CLINDAMYCIN PHOSPHATE 900 MG/50ML IV SOLN
900.0000 mg | INTRAVENOUS | Status: AC
  Administered 2021-08-28: 900 mg via INTRAVENOUS
  Filled 2021-08-28: qty 50

## 2021-08-28 MED ORDER — PROPOFOL 10 MG/ML IV BOLUS
INTRAVENOUS | Status: DC | PRN
  Administered 2021-08-28: 200 mg via INTRAVENOUS

## 2021-08-28 MED ORDER — OXYCODONE HCL 5 MG PO TABS: 5.0000 mg | ORAL_TABLET | ORAL | 0 refills | Status: AC | PRN

## 2021-08-28 MED ORDER — KETOROLAC TROMETHAMINE 30 MG/ML IJ SOLN
INTRAMUSCULAR | Status: AC
  Filled 2021-08-28: qty 1

## 2021-08-28 MED ORDER — AMISULPRIDE (ANTIEMETIC) 5 MG/2ML IV SOLN
10.0000 mg | Freq: Once | INTRAVENOUS | Status: AC
  Filled 2021-08-28: qty 4

## 2021-08-28 MED ORDER — OXYCODONE HCL 5 MG PO TABS
ORAL_TABLET | ORAL | Status: AC
  Administered 2021-08-28: 5 mg via ORAL
  Filled 2021-08-28: qty 1

## 2021-08-28 MED ORDER — LIDOCAINE HCL 1 % IJ SOLN
INTRAMUSCULAR | Status: DC | PRN
  Administered 2021-08-28: 100 mg via INTRADERMAL

## 2021-08-28 MED ORDER — MIDAZOLAM HCL 2 MG/2ML IJ SOLN
1.0000 mg | INTRAMUSCULAR | Status: DC
  Administered 2021-08-28: 2 mg via INTRAVENOUS
  Filled 2021-08-28: qty 2

## 2021-08-28 MED ORDER — AMISULPRIDE (ANTIEMETIC) 5 MG/2ML IV SOLN
INTRAVENOUS | Status: AC
  Administered 2021-08-28: 10 mg via INTRAVENOUS
  Filled 2021-08-28: qty 4

## 2021-08-28 MED ORDER — FENTANYL CITRATE (PF) 100 MCG/2ML IJ SOLN
INTRAMUSCULAR | Status: AC
  Filled 2021-08-28: qty 2

## 2021-08-28 MED ORDER — BUPIVACAINE HCL (PF) 0.25 % IJ SOLN
INTRAMUSCULAR | Status: AC
  Filled 2021-08-28: qty 10

## 2021-08-28 MED ORDER — CHLORHEXIDINE GLUCONATE 0.12 % MT SOLN
15.0000 mL | Freq: Once | OROMUCOSAL | Status: AC
  Administered 2021-08-28: 15 mL via OROMUCOSAL

## 2021-08-28 MED ORDER — MIDAZOLAM HCL 5 MG/5ML IJ SOLN
INTRAMUSCULAR | Status: DC | PRN
  Administered 2021-08-28 (×2): 1 mg via INTRAVENOUS

## 2021-08-28 MED ORDER — FENTANYL CITRATE PF 50 MCG/ML IJ SOSY
50.0000 ug | PREFILLED_SYRINGE | INTRAMUSCULAR | Status: DC
  Administered 2021-08-28: 50 ug via INTRAVENOUS
  Filled 2021-08-28: qty 2

## 2021-08-28 MED ORDER — KETOROLAC TROMETHAMINE 30 MG/ML IJ SOLN
30.0000 mg | Freq: Once | INTRAMUSCULAR | Status: AC | PRN
  Administered 2021-08-28: 30 mg via INTRAVENOUS

## 2021-08-28 MED ORDER — BUPIVACAINE HCL (PF) 0.25 % IJ SOLN
INTRAMUSCULAR | Status: AC
  Filled 2021-08-28: qty 30

## 2021-08-28 MED ORDER — FENTANYL CITRATE PF 50 MCG/ML IJ SOSY
PREFILLED_SYRINGE | INTRAMUSCULAR | Status: AC
  Administered 2021-08-28: 50 ug via INTRAVENOUS
  Filled 2021-08-28: qty 2

## 2021-08-28 MED ORDER — ONDANSETRON HCL 4 MG/2ML IJ SOLN
INTRAMUSCULAR | Status: DC | PRN
Start: 2021-08-28 — End: 2021-08-28
  Administered 2021-08-28: 4 mg via INTRAVENOUS

## 2021-08-28 MED ORDER — LIDOCAINE HCL (PF) 2 % IJ SOLN
INTRAMUSCULAR | Status: AC
  Filled 2021-08-28: qty 5

## 2021-08-28 MED ORDER — ONDANSETRON HCL 4 MG/2ML IJ SOLN
INTRAMUSCULAR | Status: AC
  Filled 2021-08-28: qty 2

## 2021-08-28 MED ORDER — PROMETHAZINE HCL 25 MG/ML IJ SOLN: 6.2500 mg | INTRAMUSCULAR | Status: DC | PRN

## 2021-08-28 MED ORDER — ORAL CARE MOUTH RINSE: 15.0000 mL | Freq: Once | OROMUCOSAL | Status: AC

## 2021-08-28 MED ORDER — LACTATED RINGERS IV SOLN: INTRAVENOUS | Status: DC

## 2021-08-28 MED ORDER — FENTANYL CITRATE PF 50 MCG/ML IJ SOSY
25.0000 ug | PREFILLED_SYRINGE | INTRAMUSCULAR | Status: DC | PRN
  Administered 2021-08-28: 50 ug via INTRAVENOUS

## 2021-08-28 MED ORDER — PROPOFOL 10 MG/ML IV BOLUS
INTRAVENOUS | Status: AC
  Filled 2021-08-28: qty 20

## 2021-08-28 MED ORDER — ROPIVACAINE HCL 5 MG/ML IJ SOLN
INTRAMUSCULAR | Status: DC | PRN
Start: 2021-08-28 — End: 2021-08-28
  Administered 2021-08-28: 20 mL via PERINEURAL

## 2021-08-28 MED ORDER — DEXAMETHASONE SODIUM PHOSPHATE 10 MG/ML IJ SOLN
INTRAMUSCULAR | Status: AC
  Filled 2021-08-28: qty 1

## 2021-08-28 MED ORDER — DEXAMETHASONE SODIUM PHOSPHATE 10 MG/ML IJ SOLN
INTRAMUSCULAR | Status: DC | PRN
  Administered 2021-08-28: 5 mg via INTRAVENOUS

## 2021-08-28 MED ORDER — OXYCODONE HCL 5 MG PO TABS: 5.0000 mg | ORAL_TABLET | Freq: Once | ORAL | Status: AC | PRN

## 2021-08-28 MED ORDER — MIDAZOLAM HCL 2 MG/2ML IJ SOLN
INTRAMUSCULAR | Status: AC
  Filled 2021-08-28: qty 2

## 2021-08-28 SURGICAL SUPPLY — 34 items
BANDAGE ESMARK 6X9 LF (GAUZE/BANDAGES/DRESSINGS) IMPLANT
BLADE SHAVER TORPEDO 4X13 (MISCELLANEOUS) ×2 IMPLANT
BNDG CMPR 9X6 STRL LF SNTH (GAUZE/BANDAGES/DRESSINGS)
BNDG ELASTIC 6X5.8 VLCR STR LF (GAUZE/BANDAGES/DRESSINGS) ×2 IMPLANT
BNDG ESMARK 6X9 LF (GAUZE/BANDAGES/DRESSINGS)
CUFF TOURN SGL QUICK 34 (TOURNIQUET CUFF) ×2
CUFF TRNQT CYL 34X4.125X (TOURNIQUET CUFF) ×1 IMPLANT
DRAPE ARTHROSCOPY W/POUCH 114 (DRAPES) ×2 IMPLANT
DRAPE U-SHAPE 47X51 STRL (DRAPES) ×2 IMPLANT
DRSG PAD ABDOMINAL 8X10 ST (GAUZE/BANDAGES/DRESSINGS) ×3 IMPLANT
DURAPREP 26ML APPLICATOR (WOUND CARE) ×2 IMPLANT
GAUZE 4X4 16PLY ~~LOC~~+RFID DBL (SPONGE) ×2 IMPLANT
GAUZE SPONGE 4X4 12PLY STRL (GAUZE/BANDAGES/DRESSINGS) ×2 IMPLANT
GAUZE XEROFORM 1X8 LF (GAUZE/BANDAGES/DRESSINGS) ×2 IMPLANT
GLOVE SRG 8 PF TXTR STRL LF DI (GLOVE) ×2 IMPLANT
GLOVE SURG ENC MOIS LTX SZ7.5 (GLOVE) ×4 IMPLANT
GLOVE SURG UNDER POLY LF SZ8 (GLOVE) ×4
GOWN STRL REUS W/TWL XL LVL3 (GOWN DISPOSABLE) ×4 IMPLANT
IMMOBILIZER KNEE 20 (SOFTGOODS) ×2 IMPLANT
IMMOBILIZER KNEE 20 THIGH 36 (SOFTGOODS) IMPLANT
IV NS IRRIG 3000ML ARTHROMATIC (IV SOLUTION) ×4 IMPLANT
KIT BASIN OR (CUSTOM PROCEDURE TRAY) ×2 IMPLANT
MANIFOLD NEPTUNE II (INSTRUMENTS) ×2 IMPLANT
PACK ARTHROSCOPY DSU (CUSTOM PROCEDURE TRAY) ×2 IMPLANT
PAD ARMBOARD 7.5X6 YLW CONV (MISCELLANEOUS) IMPLANT
PORT APPOLLO RF 90DEGREE MULTI (SURGICAL WAND) IMPLANT
STRIP CLOSURE SKIN 1/2X4 (GAUZE/BANDAGES/DRESSINGS) ×1 IMPLANT
SUT ETHILON 4 0 PS 2 18 (SUTURE) ×2 IMPLANT
SYR CONTROL 10ML LL (SYRINGE) IMPLANT
TOWEL OR 17X26 10 PK STRL BLUE (TOWEL DISPOSABLE) ×2 IMPLANT
TUBING ARTHROSCOPY IRRIG 16FT (MISCELLANEOUS) ×2 IMPLANT
TUBING CONNECTING 10 (TUBING) ×2 IMPLANT
WAND APOLLORF SJ50 AR-9845 (SURGICAL WAND) ×1 IMPLANT
WATER STERILE IRR 500ML POUR (IV SOLUTION) ×2 IMPLANT

## 2021-08-28 NOTE — Anesthesia Procedure Notes (Signed)
Anesthesia Regional Block: Adductor canal block   Pre-Anesthetic Checklist: , timeout performed,  Correct Patient, Correct Site, Correct Laterality,  Correct Procedure, Correct Position, site marked,  Risks and benefits discussed,  Surgical consent,  Pre-op evaluation,  At surgeon's request and post-op pain management  Laterality: Left  Prep: chloraprep       Needles:  Injection technique: Single-shot  Needle Type: Echogenic Needle     Needle Length: 9cm      Additional Needles:   Procedures:,,,, ultrasound used (permanent image in chart),,    Narrative:  Start time: 08/28/2021 11:43 AM End time: 08/28/2021 11:51 AM Injection made incrementally with aspirations every 5 mL.  Performed by: Personally  Anesthesiologist: Eilene Ghazi, MD  Additional Notes: Patient tolerated the procedure well without complications

## 2021-08-28 NOTE — Op Note (Addendum)
Surgery Date: 08/28/2021  Surgeon(s): Yolonda Kida, MD   Assistant: Dion Saucier, PA-C  Attestation:  PA Vivian, [place] present for the entire procedure.  ANESTHESIA:  general, adductor canal regional  FLUIDS: Per anesthesia record.   ESTIMATED BLOOD LOSS: minimal  PREOPERATIVE DIAGNOSES:  1.  Left knee postoperative arthrofibrosis 2. History of left total knee arthroplasty  POSTOPERATIVE DIAGNOSES:  same  PROCEDURES PERFORMED:  1.  Left knee arthroscopic lysis of adhesions for arthrofibrosis 2.  Manipulation of left total knee arthroplasty under anesthesia  DESCRIPTION OF PROCEDURE: Judith Kennedy is a 46 y.o.-year-old female with left knee postoperative arthrofibrosis following a total knee arthroplasty last July.  She has failed to improve significantly following conservative treatment.  She is here today for intervention with arthroscopy and manipulation of the knee to achieve better range of motion.  Full discussion held regarding risks benefits alternatives and complications related surgical intervention. Conservative care options reviewed. All questions answered.  The patient was identified in the preoperative holding area and the operative extremity was marked. The patient was brought to the operating room and transferred to operating table in a supine position. Satisfactory general anesthesia was induced by anesthesiology.    Standard anterolateral, anteromedial arthroscopy portals were obtained. The anteromedial portal was obtained with a spinal needle for localization under direct visualization with subsequent diagnostic findings.   Diagnostic arthroscopy did show pretty significant arthrofibrosis in the anterior compartment as well as medial and lateral.  There were no obvious signs of any loosening of the total knee arthroplasty or the patella resurfacing button over the polyethylene liner.  We began the procedure by performing lateral compartment synovectomy and  lysis of adhesion.  First utilizing the baskets biters we opened up the anterior medial aspect of the knee.  We then used the radiofrequency wand to achieve hemostasis as well as to continue this lysis.  We then used the shaver to open up this lysis as widely as possible to prevent recurrence.  We then flipped our orientation while viewing from the medial compartment and working from the lateral compartment to do the same on the medial side.  After completion of synovectomy, diagnostic exam, and debridements as described, all compartments were checked and no residual debris remained. Hemostasis was achieved with the cautery wand.   Next, we moved to our manipulation of the knee under anesthesia.  With gentle pressure applied with both extension and flexion we were able to get the knee moving for a total arc of 5 degrees to 125 degrees to perhaps may be up to 130 degrees but with pretty firm endpoint at that position.  At this time we felt this was an adequate increase from her preoperative exam of about 80 degrees.   The portals were approximated with buried monocryl. All excess fluid was expressed from the joint.  Xeroform sterile gauze dressings were applied followed by Ace bandage and ice pack.   DISPOSITION: The patient was awakened from general anesthetic, extubated, taken to the recovery room in medically stable condition, no apparent complications. The patient may be weightbearing as tolerated to the operative lower extremity.  Range of motion of right knee as tolerated.  Discharge home today from PACU.  She will need to follow-up with outpatient physical therapy starting tomorrow.  She was placed in a knee immobilizer to be worn at nighttime for the next 2 weeks.

## 2021-08-28 NOTE — H&P (Signed)
ORTHOPAEDIC H and P  REQUESTING PHYSICIAN: Nicholes Stairs, MD  PCP:  Adalberto Ill, DO  Chief Complaint: Rigiht knee arthrofibrosis  HPI: Judith Kennedy is a 46 y.o. female who complains of significant left knee stiffness and pain following a total knee arthroplasty back in the summer 2022.  She is also had her right knee replaced but no issues with that.  She is here today for arthroscopic lysis of adhesion and manipulation of the knee to achieve better range of motion.  No new complaints.  Past Medical History:  Diagnosis Date   Arthritis    GERD (gastroesophageal reflux disease)    Past Surgical History:  Procedure Laterality Date   CARPAL TUNNEL RELEASE Right    KNEE ARTHROPLASTY Left 03/09/2021   Procedure: COMPUTER ASSISTED TOTAL KNEE ARTHROPLASTY;  Surgeon: Rod Can, MD;  Location: [facility] ORS;  Service: Orthopedics;  Laterality: Left;  148min   KNEE ARTHROPLASTY Right 06/08/2021   Procedure: COMPUTER ASSISTED TOTAL KNEE ARTHROPLASTY;  Surgeon: Rod Can, MD;  Location: [facility] ORS;  Service: Orthopedics;  Laterality: Right;   Social History   Socioeconomic History   Marital status: Single    Spouse name: Not on file   Number of children: Not on file   Years of education: Not on file   Highest education level: Not on file  Occupational History   Not on file  Tobacco Use   Smoking status: Never   Smokeless tobacco: Never  Vaping Use   Vaping Use: Never used  Substance and Sexual Activity   Alcohol use: No   Drug use: No   Sexual activity: Not on file  Other Topics Concern   Not on file  Social History Narrative   Not on file   Social Determinants of Health   Financial Resource Strain: Not on file  Food Insecurity: Not on file  Transportation Needs: Not on file  Physical Activity: Not on file  Stress: Not on file  Social Connections: Not on file   History reviewed. No pertinent family history. Allergies  Allergen Reactions   Penicillins  Anaphylaxis and Swelling   Prior to Admission medications   Medication Sig Start Date End Date Taking? Authorizing Provider  folic acid (FOLVITE) 1 MG tablet Take 1 mg by mouth daily.   Yes [provider]  methotrexate (RHEUMATREX) 2.5 MG tablet Take 20 mg by mouth every Monday. 08/06/21  Yes [provider]  predniSONE (DELTASONE) 5 MG tablet Take 10 mg by mouth daily. 05/16/21  Yes [provider]  acetaminophen (TYLENOL) 325 MG tablet Take 1-2 tablets (325-650 mg total) by mouth every 6 (six) hours as needed for mild pain (pain score 1-3 or temp > 100.5). Patient not taking: Reported on 08/11/2021 06/09/21   Cherlynn June B, PA  docusate sodium (COLACE) 100 MG capsule Take 1 capsule (100 mg total) by mouth 2 (two) times daily. Patient not taking: Reported on 05/23/2021 03/10/21   Cherlynn June B, PA  ondansetron (ZOFRAN) 4 MG tablet Take 1 tablet (4 mg total) by mouth every 6 (six) hours as needed for nausea. Patient not taking: Reported on 08/11/2021 06/09/21   Cherlynn June B, PA  oxyCODONE (OXY IR/ROXICODONE) 5 MG immediate release tablet Take 1-2 tablets (5-10 mg total) by mouth every 4 (four) hours as needed for moderate pain (pain score 4-6). Patient not taking: Reported on 08/11/2021 06/09/21   Dorothyann Peng, PA  senna (SENOKOT) 8.6 MG TABS tablet Take 1 tablet (8.6 mg  total) by mouth 2 (two) times daily. Patient not taking: Reported on 08/11/2021 06/09/21   Cherlynn June B, PA   No results found.  Positive ROS: All other systems have been reviewed and were otherwise negative with the exception of those mentioned in the HPI and as above.  Physical Exam: General: Alert, no acute distress Cardiovascular: No pedal edema Respiratory: No cyanosis, no use of accessory musculature GI: No organomegaly, abdomen is soft and non-tender Skin: No lesions in the area of chief complaint Neurologic: Sensation intact distally Psychiatric: Patient is  competent for consent with normal mood and affect Lymphatic: No axillary or cervical lymphadenopathy  MUSCULOSKELETAL: Left knee has nicely healed midline incision.  No effusion.  Limited range of motion with extension 15 to flexion of 80.  Neurovascular intact throughout.  Assessment: Left knee arthrofibrosis  Plan: -Plan to proceed today with arthroscopic lysis of adhesion with manipulation of the left knee.  We discussed the risk of bleeding, infection, damage to surrounding nerves and vessels, stiffness, DVT, failure of pain relief, and the need for revision surgery.  She is agreement with that plan.  -She will discharge home today from PACU but will follow-up tomorrow with outpatient therapy.    Nicholes Stairs, MD Cell [phone]    08/28/2021 1:03 PM

## 2021-08-28 NOTE — Anesthesia Postprocedure Evaluation (Signed)
Anesthesia Post Note  Patient: Judith Kennedy  Procedure(s) Performed: ARTHROSCOPY KNEE WITH LYSIS OF ADHESIONS (Left: Knee)     Patient location during evaluation: PACU Anesthesia Type: General Level of consciousness: awake and alert Pain management: pain level controlled Vital Signs Assessment: post-procedure vital signs reviewed and stable Respiratory status: spontaneous breathing, nonlabored ventilation and respiratory function stable Cardiovascular status: blood pressure returned to baseline and stable Postop Assessment: no apparent nausea or vomiting Anesthetic complications: no   No notable events documented.  Last Vitals:  Vitals:   08/28/21 1445 08/28/21 1500  BP: (!) 140/97 136/79  Pulse: 63 62  Resp: 14 16  Temp:    SpO2: 97% 100%    Last Pain:  Vitals:   08/28/21 1500  TempSrc:   PainSc: 0-No pain                 Adalene Gulotta,W. EDMOND

## 2021-08-28 NOTE — Anesthesia Preprocedure Evaluation (Signed)
Anesthesia Evaluation  Patient identified by MRN, date of birth, ID band Patient awake    Reviewed: Allergy & Precautions, NPO status , Patient's Chart, lab work & pertinent test results  Airway Mallampati: II  TM Distance: >3 FB Neck ROM: Full    Dental no notable dental hx.    Pulmonary neg pulmonary ROS,    Pulmonary exam normal breath sounds clear to auscultation       Cardiovascular negative cardio ROS Normal cardiovascular exam Rhythm:Regular Rate:Normal     Neuro/Psych negative neurological ROS  negative psych ROS   GI/Hepatic Neg liver ROS, GERD  Medicated,  Endo/Other  negative endocrine ROS  Renal/GU negative Renal ROS  negative genitourinary   Musculoskeletal  (+) Arthritis , Rheumatoid disorders,    Abdominal   Peds negative pediatric ROS (+)  Hematology negative hematology ROS (+)   Anesthesia Other Findings   Reproductive/Obstetrics negative OB ROS                             Anesthesia Physical Anesthesia Plan  ASA: 2  Anesthesia Plan: General   Post-op Pain Management: Regional block   Induction: Intravenous  PONV Risk Score and Plan: 3 and Ondansetron, Dexamethasone, Midazolam and Treatment may vary due to age or medical condition  Airway Management Planned: LMA  Additional Equipment:   Intra-op Plan:   Post-operative Plan: Extubation in OR  Informed Consent: I have reviewed the patients History and Physical, chart, labs and discussed the procedure including the risks, benefits and alternatives for the proposed anesthesia with the patient or authorized representative who has indicated his/her understanding and acceptance.     Dental advisory given  Plan Discussed with: CRNA and Surgeon  Anesthesia Plan Comments:         Anesthesia Quick Evaluation

## 2021-08-28 NOTE — Transfer of Care (Signed)
Immediate Anesthesia Transfer of Care Note  Patient: Judith Kennedy  Procedure(s) Performed: ARTHROSCOPY KNEE WITH LYSIS OF ADHESIONS (Left: Knee)  Patient Location: PACU  Anesthesia Type:General  Level of Consciousness: drowsy  Airway & Oxygen Therapy: Patient Spontanous Breathing and Patient connected to nasal cannula oxygen  Post-op Assessment: Report given to RN and Post -op Vital signs reviewed and stable  Post vital signs: Reviewed and stable  Last Vitals:  Vitals Value Taken Time  BP 147/98 08/28/21 1433  Temp    Pulse 68 08/28/21 1434  Resp 14 08/28/21 1434  SpO2 98 % 08/28/21 1434  Vitals shown include unvalidated device data.  Last Pain:  Vitals:   08/28/21 1023  TempSrc: Oral         Complications: No notable events documented.

## 2021-08-28 NOTE — Discharge Instructions (Signed)
Orthopedic surgery discharge instructions.  -Maintain leg in your knee immobilizer when sleeping.  He should do this at least for the first 2 weeks following surgery.  You may remove it for therapy and your daily exercises.  -Maintain postoperative bandages for 3 days.  May remove these bandages on Thursday and begin showering at that time.  Please do not submerge underwater until your follow-up appointment with Dr. Aundria Rud.  -For the prevention of blood clots you will take an 81 mg aspirin once per day for 6 weeks.  -For mild to moderate pain use Tylenol and Advil around-the-clock.  For breakthrough pain use hydrocodone as directed.  -Follow-up with Dr. Aundria Rud in 2 weeks.

## 2021-08-28 NOTE — Anesthesia Procedure Notes (Signed)
Procedure Name: LMA Insertion Date/Time: 08/28/2021 1:27 PM Performed by: Garth Bigness, CRNA Pre-anesthesia Checklist: Patient identified, Emergency Drugs available, Suction available and Patient being monitored Patient Re-evaluated:Patient Re-evaluated prior to induction Oxygen Delivery Method: Circle system utilized Preoxygenation: Pre-oxygenation with 100% oxygen Induction Type: IV induction Ventilation: Mask ventilation without difficulty LMA: LMA inserted LMA Size: 4.0 Number of attempts: 1 Placement Confirmation: positive ETCO2 Tube secured with: Tape Dental Injury: Teeth and Oropharynx as per pre-operative assessment

## 2021-08-28 NOTE — Brief Op Note (Signed)
08/28/2021  2:23 PM  PATIENT:  Judith Kennedy  46 y.o. female  PRE-OPERATIVE DIAGNOSIS:  Left knee arthrofibrosis  POST-OPERATIVE DIAGNOSIS:  Left knee arthrofibrosis  PROCEDURE:  Procedure(s) with comments: ARTHROSCOPY KNEE WITH LYSIS OF ADHESIONS (Left) - 60  SURGEON:  Surgeon(s) and Role:    * Stann Mainland, Elly Modena, MD - Primary  PHYSICIAN ASSISTANT: Jonelle Sidle, PA-C  ANESTHESIA:   regional and general  EBL:  10 mL   BLOOD ADMINISTERED:none  DRAINS: none   LOCAL MEDICATIONS USED:  NONE  SPECIMEN:  No Specimen  DISPOSITION OF SPECIMEN:  N/A  COUNTS:  YES  TOURNIQUET:  * Missing tourniquet times found for documented tourniquets in log: [place]:[id number] *  DICTATION: .Note written in EPIC  PLAN OF CARE: Discharge to home after PACU  PATIENT DISPOSITION:  PACU - hemodynamically stable.   Delay start of Pharmacological VTE agent (>24hrs) due to surgical blood loss or risk of bleeding: not applicable

## 2021-08-28 NOTE — Anesthesia Procedure Notes (Signed)
Anesthesia Procedure Image    

## 2021-08-28 NOTE — Progress Notes (Signed)
Assisted Dr. Rose with left, ultrasound guided, adductor canal block. Side rails up, monitors on throughout procedure. See vital signs in flow sheet. Tolerated Procedure well.  

## 2021-08-29 ENCOUNTER — Encounter (HOSPITAL_COMMUNITY): Payer: Self-pay | Admitting: Orthopedic Surgery

## 2022-01-21 IMAGING — DX DG KNEE 1-2V PORT*L*
2 series · 2 of 2 positions shown · non-contrast
Comparison: None.

CLINICAL DATA: Total knee arthroplasty

EXAM:
PORTABLE LEFT KNEE - 1-2 VIEW

[knee ap]
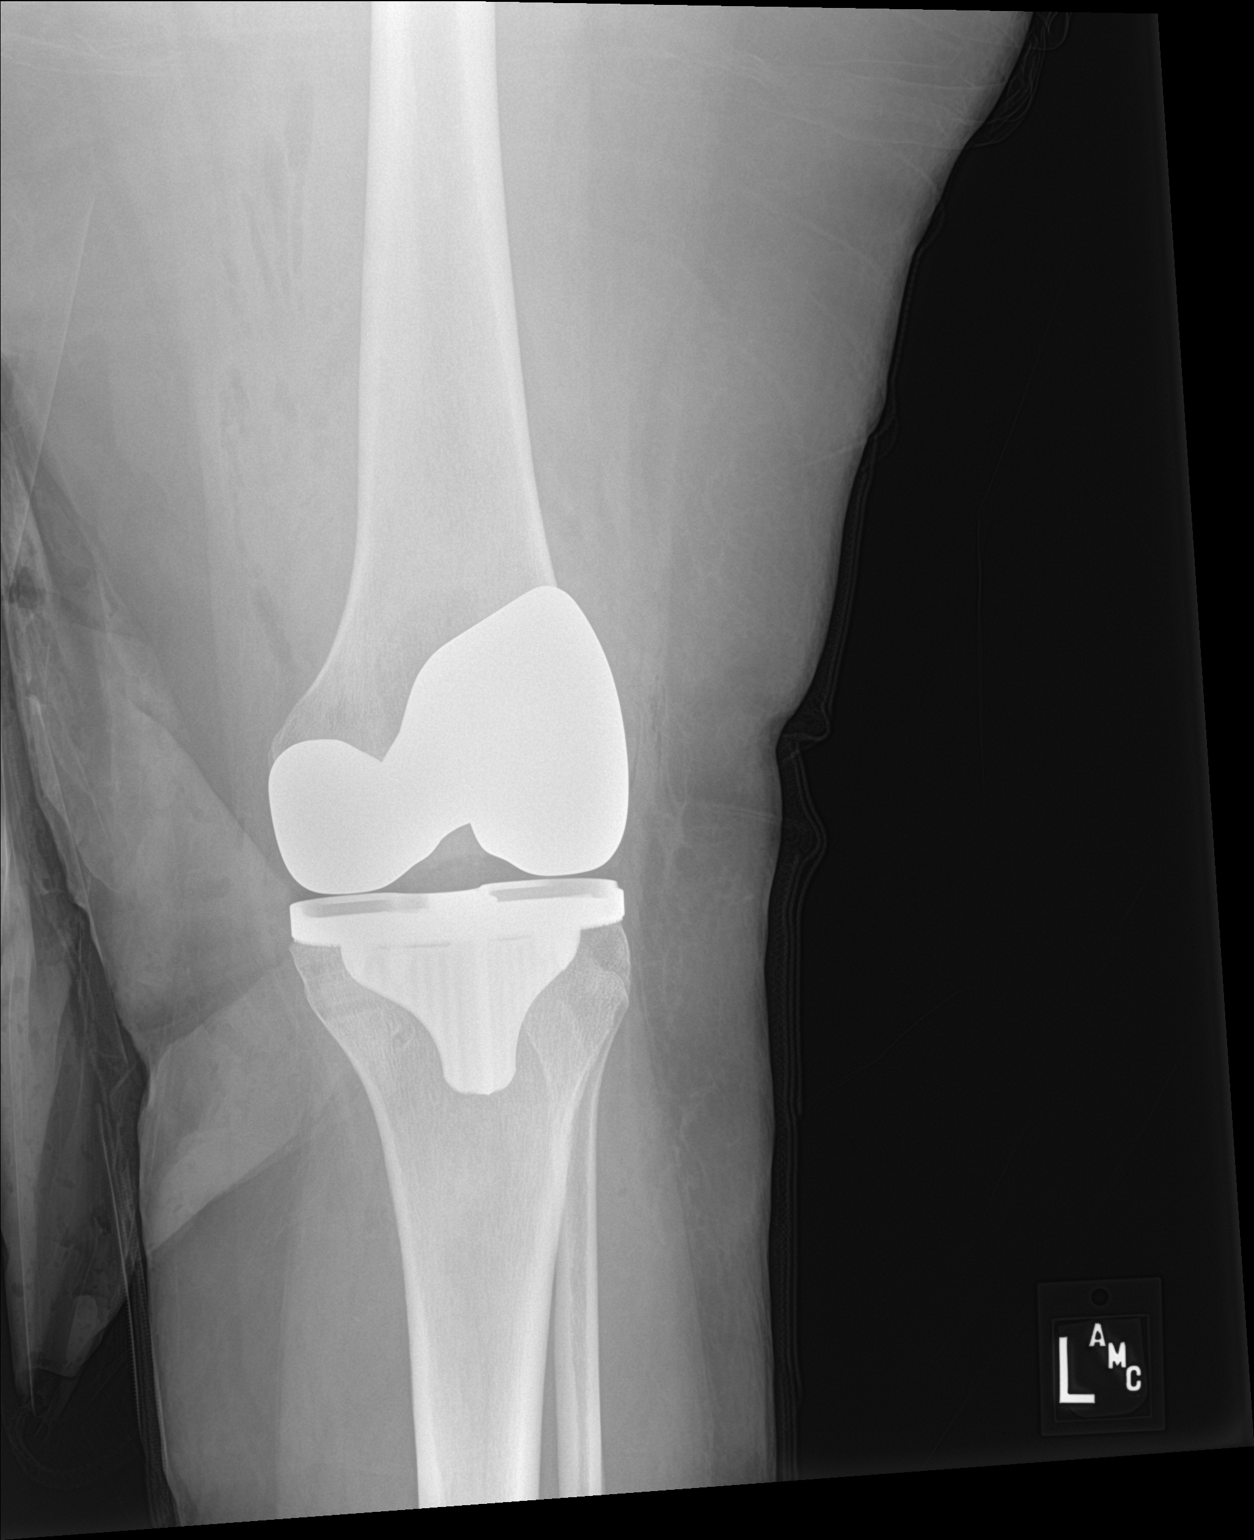

[knee lat]
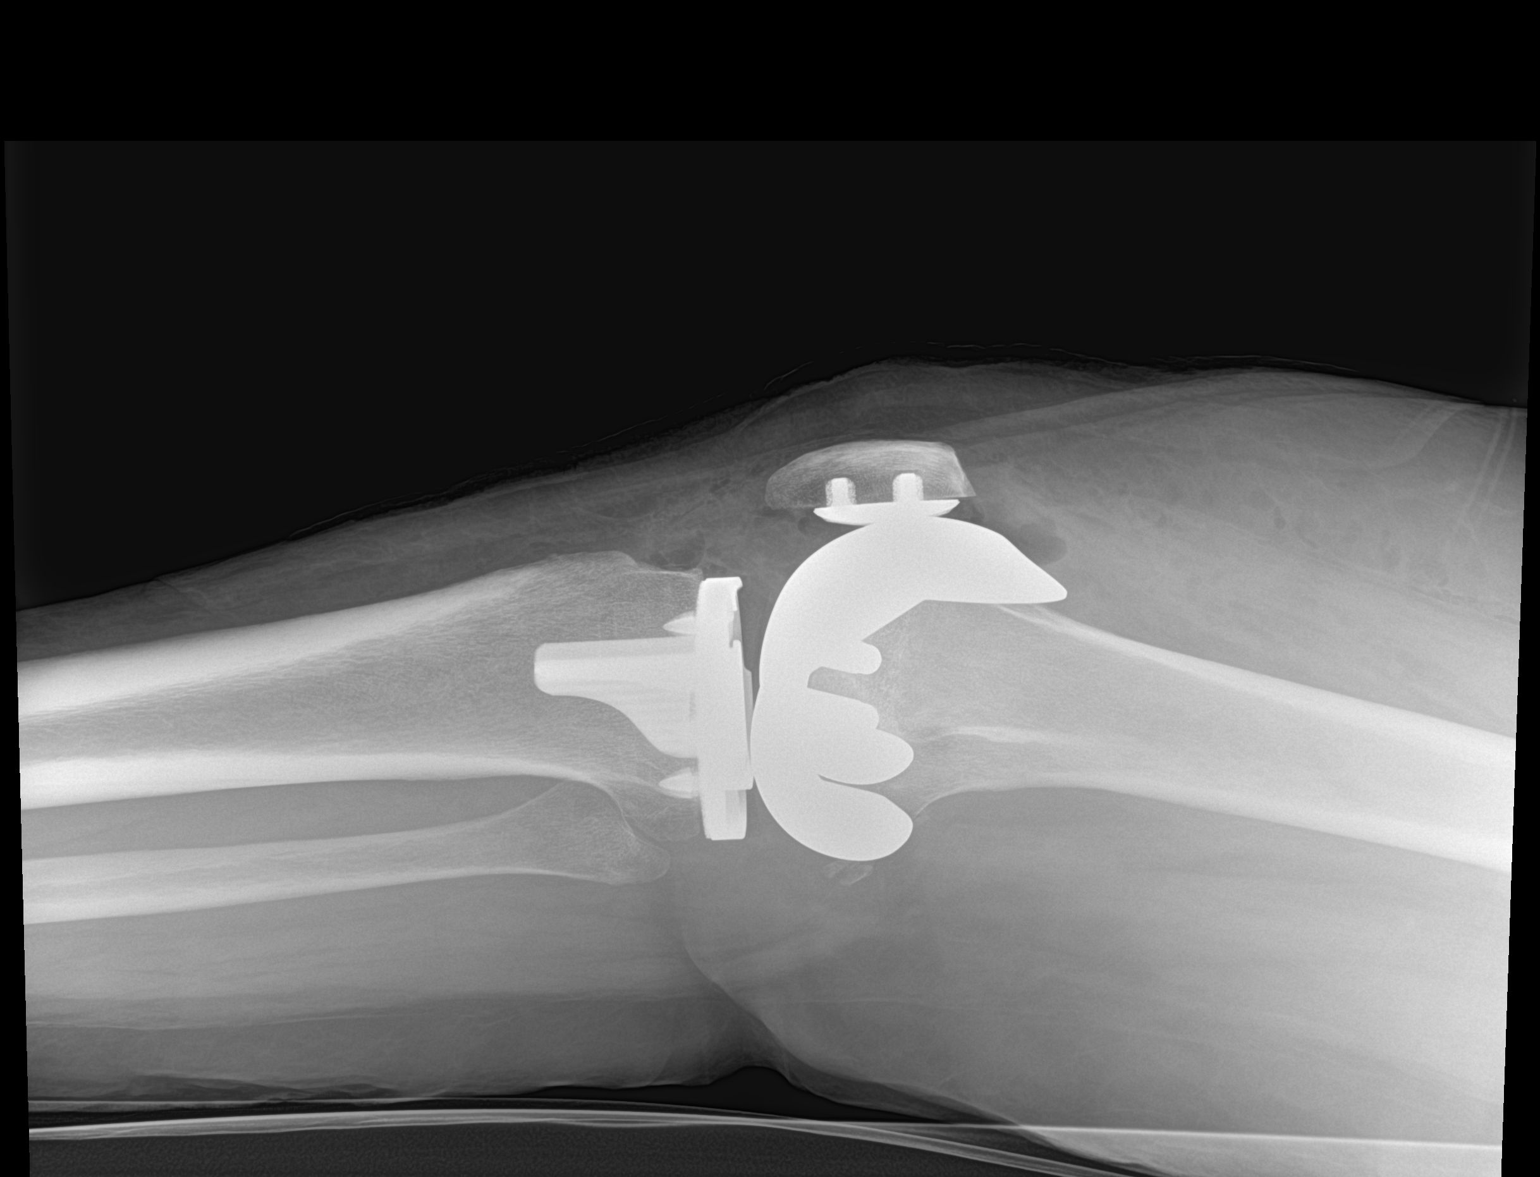

[2 of 2 positions shown; findings below may reference images not displayed]

FINDINGS: Status post left knee total arthroplasty with expected overlying
postoperative change. No evidence of perihardware fracture or
component malposition.
IMPRESSION: Status post left knee total arthroplasty with expected overlying
postoperative change. No evidence of perihardware fracture or
component malposition.
